# Patient Record
Sex: Male | Born: 1935 | Race: White | Hispanic: No | Marital: Married | Smoking: Former smoker
Health system: Southern US, Community
[De-identification: ages and names within clinical notes are randomized; demographics above are authoritative.]

## PROBLEM LIST (undated history)

## (undated) DIAGNOSIS — N189 Chronic kidney disease, unspecified: Secondary | ICD-10-CM

## (undated) DIAGNOSIS — N183 Chronic kidney disease, stage 3 unspecified: Secondary | ICD-10-CM

## (undated) DIAGNOSIS — C4490 Unspecified malignant neoplasm of skin, unspecified: Secondary | ICD-10-CM

## (undated) DIAGNOSIS — G309 Alzheimer's disease, unspecified: Secondary | ICD-10-CM

## (undated) DIAGNOSIS — I10 Essential (primary) hypertension: Secondary | ICD-10-CM

## (undated) DIAGNOSIS — F0280 Dementia in other diseases classified elsewhere without behavioral disturbance: Secondary | ICD-10-CM

## (undated) DIAGNOSIS — I639 Cerebral infarction, unspecified: Secondary | ICD-10-CM

## (undated) HISTORY — DX: Cerebral infarction, unspecified: I63.9

## (undated) HISTORY — DX: Alzheimer's disease, unspecified: G30.9

## (undated) HISTORY — DX: Dementia in other diseases classified elsewhere, unspecified severity, without behavioral disturbance, psychotic disturbance, mood disturbance, and anxiety: F02.80

## (undated) HISTORY — DX: Unspecified malignant neoplasm of skin, unspecified: C44.90

## (undated) HISTORY — PX: MOHS SURGERY: SUR867

## (undated) HISTORY — DX: Chronic kidney disease, stage 3 (moderate): N18.3

## (undated) HISTORY — DX: Chronic kidney disease, stage 3 unspecified: N18.30

## (undated) HISTORY — PX: OTHER SURGICAL HISTORY: SHX169

---

## 2007-08-24 ENCOUNTER — Ambulatory Visit: Payer: Self-pay | Admitting: Internal Medicine

## 2007-08-25 ENCOUNTER — Ambulatory Visit: Payer: Self-pay | Admitting: Internal Medicine

## 2007-08-26 ENCOUNTER — Ambulatory Visit: Payer: Self-pay | Admitting: Emergency Medicine

## 2007-12-15 ENCOUNTER — Emergency Department: Payer: Self-pay | Admitting: Emergency Medicine

## 2010-02-14 ENCOUNTER — Ambulatory Visit: Payer: Self-pay | Admitting: Family Medicine

## 2015-04-28 ENCOUNTER — Encounter: Payer: Self-pay | Admitting: Emergency Medicine

## 2015-04-28 ENCOUNTER — Emergency Department
Admission: EM | Admit: 2015-04-28 | Discharge: 2015-04-28 | Disposition: A | Discharge status: Home / Self Care | Payer: Medicare Other | Attending: Emergency Medicine | Admitting: Emergency Medicine

## 2015-04-28 ENCOUNTER — Emergency Department: Payer: Medicare Other

## 2015-04-28 DIAGNOSIS — Y9389 Activity, other specified: Secondary | ICD-10-CM | POA: Diagnosis not present

## 2015-04-28 DIAGNOSIS — R030 Elevated blood-pressure reading, without diagnosis of hypertension: Secondary | ICD-10-CM | POA: Insufficient documentation

## 2015-04-28 DIAGNOSIS — R55 Syncope and collapse: Secondary | ICD-10-CM | POA: Diagnosis not present

## 2015-04-28 DIAGNOSIS — Y92002 Bathroom of unspecified non-institutional (private) residence single-family (private) house as the place of occurrence of the external cause: Secondary | ICD-10-CM | POA: Diagnosis not present

## 2015-04-28 DIAGNOSIS — R42 Dizziness and giddiness: Secondary | ICD-10-CM | POA: Diagnosis present

## 2015-04-28 DIAGNOSIS — S0990XA Unspecified injury of head, initial encounter: Secondary | ICD-10-CM | POA: Insufficient documentation

## 2015-04-28 DIAGNOSIS — IMO0001 Reserved for inherently not codable concepts without codable children: Secondary | ICD-10-CM

## 2015-04-28 DIAGNOSIS — Z87891 Personal history of nicotine dependence: Secondary | ICD-10-CM | POA: Diagnosis not present

## 2015-04-28 DIAGNOSIS — F0390 Unspecified dementia without behavioral disturbance: Secondary | ICD-10-CM | POA: Diagnosis not present

## 2015-04-28 DIAGNOSIS — Z66 Do not resuscitate: Secondary | ICD-10-CM | POA: Insufficient documentation

## 2015-04-28 DIAGNOSIS — W1839XA Other fall on same level, initial encounter: Secondary | ICD-10-CM | POA: Diagnosis not present

## 2015-04-28 DIAGNOSIS — Y998 Other external cause status: Secondary | ICD-10-CM | POA: Diagnosis not present

## 2015-04-28 LAB — CBC WITH DIFFERENTIAL/PLATELET
Basophils Absolute: 0.1 10*3/uL (ref 0–0.1)
Basophils Relative: 1 %
EOS ABS: 0.1 10*3/uL (ref 0–0.7)
Eosinophils Relative: 1 %
HCT: 43.5 % (ref 40.0–52.0)
HEMOGLOBIN: 14.4 g/dL (ref 13.0–18.0)
LYMPHS ABS: 0.9 10*3/uL — AB (ref 1.0–3.6)
Lymphocytes Relative: 10 %
MCH: 29.8 pg (ref 26.0–34.0)
MCHC: 33.1 g/dL (ref 32.0–36.0)
MCV: 89.9 fL (ref 80.0–100.0)
MONO ABS: 0.4 10*3/uL (ref 0.2–1.0)
MONOS PCT: 4 %
NEUTROS ABS: 7.8 10*3/uL — AB (ref 1.4–6.5)
NEUTROS PCT: 84 %
PLATELETS: 207 10*3/uL (ref 150–440)
RBC: 4.84 MIL/uL (ref 4.40–5.90)
RDW: 14.1 % (ref 11.5–14.5)
WBC: 9.2 10*3/uL (ref 3.8–10.6)

## 2015-04-28 LAB — BASIC METABOLIC PANEL
Anion gap: 10 (ref 5–15)
BUN: 24 mg/dL — AB (ref 6–20)
CO2: 27 mmol/L (ref 22–32)
CREATININE: 1.09 mg/dL (ref 0.61–1.24)
Calcium: 8.7 mg/dL — ABNORMAL LOW (ref 8.9–10.3)
Chloride: 102 mmol/L (ref 101–111)
GFR calc Af Amer: >60 mL/min (ref >60)
GFR, EST NON AFRICAN AMERICAN: 58 mL/min — AB (ref >60)
GLUCOSE: 100 mg/dL — AB (ref 65–99)
Potassium: 3.4 mmol/L — ABNORMAL LOW (ref 3.5–5.1)
SODIUM: 139 mmol/L (ref 135–145)

## 2015-04-28 LAB — TROPONIN I: TROPONIN I: 0.03 ng/mL (ref <0.031)

## 2015-04-28 NOTE — ED Notes (Signed)
Patient transported to CT/XR ?

## 2015-04-28 NOTE — Discharge Instructions (Signed)
We noticed that Mr. Longenberger blood pressure is elevated today. We asked him to follow closely with your primary care doctor in the next day or 2 for recheck. If he has a headache, chest pain shortness of breath or any other new or worrisome symptoms including numbness or weakness or difficulty walking or talking or seems confused please return to the emergency department. He would prefer not to be admitted to the hospital which is certainly not unreasonable but it does mean that we ask you to be as visual this possible at home.

## 2015-04-28 NOTE — ED Notes (Signed)
Per MD Mcshane, no urine needed prior to d/c. Pt and family made aware

## 2015-04-28 NOTE — ED Provider Notes (Addendum)
[facility] Emergency Department Provider Note  ____________________________________________   I have reviewed the triage vital signs and the nursing notes.   HISTORY  Chief Complaint Dizziness    HPI Jacob Wade is a 82 y.o. male male who suffers from heart is appearing, he does not take any medications. His wife is with him in the bedroom tonight. He got up right here to the bathroom and he fell. She saw him fall. He hit his head. He has memory issues and does not remember the fall. His wife states he did not pass out. Patient is DO NOT RESUSCITATE. He has not had any other chest pain shows breath nausea vomiting headache or other complaints before the fall. This time his only complaint is that he bumped his head. He denies any other complaints. He has no focal numbness or weakness he has no headache and he was acting his normal self yesterday. Family states he does have a history of a subdural hematoma years ago, 30, however, no residual weakness. He himself states that he feels "okay". Family states he has poor memory at baseline but he seems like is a little bit worse today in that respect. His wife does state he seemed to be a little unsteady on his feet when he first stood up before he fell. Patient takes no medication.  Past Medical History  Diagnosis Date  . Dementia     There are no active problems to display for this patient.   History reviewed. No pertinent past surgical history.  No current outpatient prescriptions on file.  Allergies Review of patient's allergies indicates no known allergies.  History reviewed. No pertinent family history.  Social History Social History  Substance Use Topics  . Smoking status: Former Research scientist (life sciences)  . Smokeless tobacco: None  . Alcohol Use: 0.6 oz/week    1 Cans of beer per week    Review of Systems Constitutional: No fever/chills Eyes: No visual changes. ENT: No sore throat. No stiff neck no neck  pain Cardiovascular: Denies chest pain. Respiratory: Denies shortness of breath. Gastrointestinal:   no vomiting.  No diarrhea.  No constipation. Genitourinary: Negative for dysuria. Musculoskeletal: Negative lower extremity swelling Skin: Negative for rash. Neurological: Negative for headaches, focal weakness or numbness. 10-point ROS otherwise negative.  ____________________________________________   PHYSICAL EXAM:  VITAL SIGNS: ED Triage Vitals  Enc Vitals Group     BP 04/28/15 1021 194/80 mmHg     Pulse Rate 04/28/15 1021 58     Resp 04/28/15 1021 14     Temp 04/28/15 1021 97.7 F (36.5 C)     Temp Source 04/28/15 1021 Oral     SpO2 04/28/15 1021 100 %     Weight 04/28/15 1021 140 lb (63.504 kg)     Height 04/28/15 1021 5\' 10"  (1.778 m)     Head Cir --      Peak Flow --      Pain Score 04/28/15 1022 0     Pain Loc --      Pain Edu? --      Excl. in [place]? --     Constitutional: Alert and oriented to name and place unsure of the year at baseline per family. Well appearing and in no acute distress. Eyes: Conjunctivae are normal. PERRL. EOMI. Head: Slight redness noted to the forehead, no skull fracture palpated. Nose: No congestion/rhinnorhea. Mouth/Throat: Mucous membranes are moist.  Oropharynx non-erythematous. Neck: No stridor.   Nontender with no meningismus Cardiovascular:  Normal rate, regular rhythm. Grossly normal heart sounds.  Good peripheral circulation. Respiratory: Normal respiratory effort.  No retractions. Lungs CTAB. Abdominal: Soft and nontender. No distention. No guarding no rebound Back:  There is no focal tenderness or step off there is no midline tenderness there are no lesions noted. there is no CVA tenderness Musculoskeletal: No lower extremity tenderness. No joint effusions, no DVT signs strong distal pulses no edema Neurologic:  Normal speech and language. No gross focal neurologic deficits are appreciated.  Skin:  Skin is warm, dry and intact.  No rash noted. Psychiatric: Mood and affect are normal. Speech and behavior are normal.  ____________________________________________   LABS (all labs ordered are listed, but only abnormal results are displayed)  Labs Reviewed  CBC WITH DIFFERENTIAL/PLATELET  BASIC METABOLIC PANEL  TROPONIN I  URINALYSIS COMPLETEWITH MICROSCOPIC ([place])   ____________________________________________  EKG  I personally interpreted any EKGs ordered by me or triage Sinus rhythm no acute ST elevation or acute ST depression normal axis and borderline nonspecific ST changes no acute ischemia. ____________________________________________  RADIOLOGY  I reviewed any imaging ordered by me or triage that were performed during my shift ____________________________________________   PROCEDURES  Procedure(s) performed: None  Critical Care performed: None  ____________________________________________   INITIAL IMPRESSION / ASSESSMENT AND PLAN / ED COURSE  Pertinent labs & imaging results that were available during my care of the patient were reviewed by me and considered in my medical decision making (see chart for details).  Ace and has what appears to be a likely non-syncopal fall though there is some question as to whether he was unsteady on his feet beforehand. We will obtain a CT scan of his head given history of subdural and closed head injury. As was likely a non-syncopal fall however given his age, check EKG Baseline troponin and urine and reassess. Is well-appearing at this time.  ----------------------------------------- 12:37 PM on 04/28/2015 -----------------------------------------  Patient has no symptoms he is eating and drinking. His baseline blood pressures in the 170s. Family states he has very anxious when he is around doctors and that with a always expect his blood pressure go up. He has not a blood pressure because they prefer to allow his blood pressure and high they state.  He has no headache no neurologic symptoms no numbness no weakness and he is eager to go home. Family declined admission despite his elevated blood pressure which do not think is unreasonable at this time there is no evidence of ongoing pathology blood work is reassuring kidney function is preserved CT is negative neurologic exam is normal he has no chest pain and have stressed the need for outpatient follow-up and close return for new or worrisome symptoms. ____________________________________________   FINAL CLINICAL IMPRESSION(S) / ED DIAGNOSES  Final diagnoses:  None      This chart was dictated using voice recognition software.  Despite best efforts to proofread,  errors can occur which can change meaning.     Schuyler Amor, MD 04/28/15 [place], MD 04/28/15 [place], MD 04/28/15 [phone]

## 2015-04-28 NOTE — ED Notes (Addendum)
Pt arrived via EMS from [facility].  It was reported that the patient was c/o dizziness and had an unwitnessed fall. Pt states he doesn't think he fell. Pt is HOH. Wife at bedside states patient fell and hit his head on a bedside table on the left side of his head.

## 2015-05-01 DIAGNOSIS — I10 Essential (primary) hypertension: Secondary | ICD-10-CM | POA: Diagnosis not present

## 2015-05-10 DIAGNOSIS — D485 Neoplasm of uncertain behavior of skin: Secondary | ICD-10-CM | POA: Diagnosis not present

## 2015-05-10 DIAGNOSIS — L720 Epidermal cyst: Secondary | ICD-10-CM | POA: Diagnosis not present

## 2015-05-10 DIAGNOSIS — C44319 Basal cell carcinoma of skin of other parts of face: Secondary | ICD-10-CM | POA: Diagnosis not present

## 2015-05-10 DIAGNOSIS — C44219 Basal cell carcinoma of skin of left ear and external auricular canal: Secondary | ICD-10-CM | POA: Diagnosis not present

## 2015-05-10 DIAGNOSIS — C44311 Basal cell carcinoma of skin of nose: Secondary | ICD-10-CM | POA: Diagnosis not present

## 2015-06-15 DIAGNOSIS — C44319 Basal cell carcinoma of skin of other parts of face: Secondary | ICD-10-CM | POA: Diagnosis not present

## 2015-06-15 DIAGNOSIS — L905 Scar conditions and fibrosis of skin: Secondary | ICD-10-CM | POA: Diagnosis not present

## 2015-06-15 DIAGNOSIS — C4441 Basal cell carcinoma of skin of scalp and neck: Secondary | ICD-10-CM | POA: Diagnosis not present

## 2015-06-22 DIAGNOSIS — L905 Scar conditions and fibrosis of skin: Secondary | ICD-10-CM | POA: Diagnosis not present

## 2015-06-22 DIAGNOSIS — C44319 Basal cell carcinoma of skin of other parts of face: Secondary | ICD-10-CM | POA: Diagnosis not present

## 2015-07-06 DIAGNOSIS — C4441 Basal cell carcinoma of skin of scalp and neck: Secondary | ICD-10-CM | POA: Diagnosis not present

## 2015-07-06 DIAGNOSIS — G301 Alzheimer's disease with late onset: Secondary | ICD-10-CM | POA: Diagnosis not present

## 2015-07-06 DIAGNOSIS — H6122 Impacted cerumen, left ear: Secondary | ICD-10-CM | POA: Diagnosis not present

## 2015-07-06 DIAGNOSIS — Z6822 Body mass index (BMI) 22.0-22.9, adult: Secondary | ICD-10-CM | POA: Diagnosis not present

## 2015-07-06 DIAGNOSIS — H9193 Unspecified hearing loss, bilateral: Secondary | ICD-10-CM | POA: Diagnosis not present

## 2015-07-06 DIAGNOSIS — C44310 Basal cell carcinoma of skin of unspecified parts of face: Secondary | ICD-10-CM | POA: Diagnosis not present

## 2015-07-06 DIAGNOSIS — F0280 Dementia in other diseases classified elsewhere without behavioral disturbance: Secondary | ICD-10-CM | POA: Diagnosis not present

## 2015-07-06 DIAGNOSIS — C4491 Basal cell carcinoma of skin, unspecified: Secondary | ICD-10-CM | POA: Diagnosis not present

## 2015-07-06 DIAGNOSIS — F0390 Unspecified dementia without behavioral disturbance: Secondary | ICD-10-CM | POA: Diagnosis not present

## 2015-07-06 DIAGNOSIS — I10 Essential (primary) hypertension: Secondary | ICD-10-CM | POA: Diagnosis not present

## 2015-07-06 DIAGNOSIS — R4189 Other symptoms and signs involving cognitive functions and awareness: Secondary | ICD-10-CM | POA: Diagnosis not present

## 2015-07-06 DIAGNOSIS — Z974 Presence of external hearing-aid: Secondary | ICD-10-CM | POA: Diagnosis not present

## 2015-08-15 ENCOUNTER — Inpatient Hospital Stay: Payer: Medicare Other

## 2015-08-15 ENCOUNTER — Inpatient Hospital Stay
Admission: EM | Admit: 2015-08-15 | Discharge: 2015-08-17 | DRG: 872 | Payer: Medicare Other | Attending: Internal Medicine | Admitting: Internal Medicine

## 2015-08-15 ENCOUNTER — Encounter: Payer: Self-pay | Admitting: *Deleted

## 2015-08-15 DIAGNOSIS — Z66 Do not resuscitate: Secondary | ICD-10-CM | POA: Diagnosis present

## 2015-08-15 DIAGNOSIS — Z8249 Family history of ischemic heart disease and other diseases of the circulatory system: Secondary | ICD-10-CM

## 2015-08-15 DIAGNOSIS — Z87891 Personal history of nicotine dependence: Secondary | ICD-10-CM

## 2015-08-15 DIAGNOSIS — N139 Obstructive and reflux uropathy, unspecified: Secondary | ICD-10-CM | POA: Diagnosis not present

## 2015-08-15 DIAGNOSIS — I129 Hypertensive chronic kidney disease with stage 1 through stage 4 chronic kidney disease, or unspecified chronic kidney disease: Secondary | ICD-10-CM | POA: Diagnosis present

## 2015-08-15 DIAGNOSIS — R338 Other retention of urine: Secondary | ICD-10-CM | POA: Diagnosis not present

## 2015-08-15 DIAGNOSIS — I10 Essential (primary) hypertension: Secondary | ICD-10-CM | POA: Diagnosis not present

## 2015-08-15 DIAGNOSIS — Z23 Encounter for immunization: Secondary | ICD-10-CM | POA: Diagnosis not present

## 2015-08-15 DIAGNOSIS — Z801 Family history of malignant neoplasm of trachea, bronchus and lung: Secondary | ICD-10-CM | POA: Diagnosis not present

## 2015-08-15 DIAGNOSIS — E872 Acidosis: Secondary | ICD-10-CM | POA: Diagnosis present

## 2015-08-15 DIAGNOSIS — N189 Chronic kidney disease, unspecified: Secondary | ICD-10-CM | POA: Diagnosis present

## 2015-08-15 DIAGNOSIS — N390 Urinary tract infection, site not specified: Secondary | ICD-10-CM | POA: Diagnosis not present

## 2015-08-15 DIAGNOSIS — A419 Sepsis, unspecified organism: Secondary | ICD-10-CM | POA: Diagnosis not present

## 2015-08-15 DIAGNOSIS — N401 Enlarged prostate with lower urinary tract symptoms: Secondary | ICD-10-CM | POA: Diagnosis not present

## 2015-08-15 DIAGNOSIS — Z888 Allergy status to other drugs, medicaments and biological substances status: Secondary | ICD-10-CM | POA: Diagnosis not present

## 2015-08-15 DIAGNOSIS — N138 Other obstructive and reflux uropathy: Secondary | ICD-10-CM | POA: Diagnosis present

## 2015-08-15 DIAGNOSIS — F0390 Unspecified dementia without behavioral disturbance: Secondary | ICD-10-CM | POA: Diagnosis present

## 2015-08-15 DIAGNOSIS — I4581 Long QT syndrome: Secondary | ICD-10-CM | POA: Diagnosis not present

## 2015-08-15 HISTORY — DX: Essential (primary) hypertension: I10

## 2015-08-15 HISTORY — DX: Chronic kidney disease, unspecified: N18.9

## 2015-08-15 LAB — CBC
HEMATOCRIT: 43.1 % (ref 40.0–52.0)
Hemoglobin: 14.1 g/dL (ref 13.0–18.0)
MCH: 29.6 pg (ref 26.0–34.0)
MCHC: 32.8 g/dL (ref 32.0–36.0)
MCV: 90.1 fL (ref 80.0–100.0)
PLATELETS: 171 10*3/uL (ref 150–440)
RBC: 4.78 MIL/uL (ref 4.40–5.90)
RDW: 14.6 % — AB (ref 11.5–14.5)
WBC: 21.9 10*3/uL — AB (ref 3.8–10.6)

## 2015-08-15 LAB — URINALYSIS COMPLETE WITH MICROSCOPIC (ARMC ONLY)
Bilirubin Urine: NEGATIVE
Glucose, UA: NEGATIVE mg/dL
Ketones, ur: NEGATIVE mg/dL
Nitrite: NEGATIVE
PH: 7 (ref 5.0–8.0)
PROTEIN: 100 mg/dL — AB
SPECIFIC GRAVITY, URINE: 1.014 (ref 1.005–1.030)
Squamous Epithelial / LPF: NONE SEEN

## 2015-08-15 LAB — LACTIC ACID, PLASMA
LACTIC ACID, VENOUS: 3.8 mmol/L — AB (ref 0.5–2.0)
LACTIC ACID, VENOUS: 1.4 mmol/L (ref 0.5–2.0)

## 2015-08-15 LAB — BASIC METABOLIC PANEL
ANION GAP: 11 (ref 5–15)
BUN: 19 mg/dL (ref 6–20)
CALCIUM: 8.6 mg/dL — AB (ref 8.9–10.3)
CO2: 26 mmol/L (ref 22–32)
Chloride: 99 mmol/L — ABNORMAL LOW (ref 101–111)
Creatinine, Ser: 1.12 mg/dL (ref 0.61–1.24)
GFR calc Af Amer: >60 mL/min (ref >60)
GFR, EST NON AFRICAN AMERICAN: 56 mL/min — AB (ref >60)
Glucose, Bld: 112 mg/dL — ABNORMAL HIGH (ref 65–99)
POTASSIUM: 3.4 mmol/L — AB (ref 3.5–5.1)
SODIUM: 136 mmol/L (ref 135–145)

## 2015-08-15 LAB — MAGNESIUM: Magnesium: 1.7 mg/dL (ref 1.7–2.4)

## 2015-08-15 MED ORDER — HEPARIN SODIUM (PORCINE) 5000 UNIT/ML IJ SOLN
5000.0000 [IU] | Freq: Three times a day (TID) | INTRAMUSCULAR | Status: DC
  Administered 2015-08-16 – 2015-08-17 (×2): 5000 [IU] via SUBCUTANEOUS
  Filled 2015-08-15 (×4): qty 1

## 2015-08-15 MED ORDER — PNEUMOCOCCAL VAC POLYVALENT 25 MCG/0.5ML IJ INJ
0.5000 mL | INJECTION | INTRAMUSCULAR | Status: AC
  Administered 2015-08-16: 0.5 mL via INTRAMUSCULAR
  Filled 2015-08-15: qty 0.5

## 2015-08-15 MED ORDER — POTASSIUM CHLORIDE CRYS ER 20 MEQ PO TBCR
EXTENDED_RELEASE_TABLET | ORAL | Status: AC
  Filled 2015-08-15: qty 1

## 2015-08-15 MED ORDER — SODIUM CHLORIDE 0.9 % IV SOLN
INTRAVENOUS | Status: AC
  Administered 2015-08-15 – 2015-08-16 (×3): via INTRAVENOUS

## 2015-08-15 MED ORDER — ACETAMINOPHEN 325 MG PO TABS
650.0000 mg | ORAL_TABLET | Freq: Once | ORAL | Status: AC
  Administered 2015-08-15: 650 mg via ORAL
  Filled 2015-08-15: qty 2

## 2015-08-15 MED ORDER — AMLODIPINE BESYLATE 5 MG PO TABS
5.0000 mg | ORAL_TABLET | Freq: Every day | ORAL | Status: DC
  Administered 2015-08-16: 5 mg via ORAL
  Filled 2015-08-15: qty 1

## 2015-08-15 MED ORDER — HYDRALAZINE HCL 20 MG/ML IJ SOLN
10.0000 mg | Freq: Four times a day (QID) | INTRAMUSCULAR | Status: DC | PRN
  Filled 2015-08-15 (×2): qty 1

## 2015-08-15 MED ORDER — SODIUM CHLORIDE 0.9 % IV BOLUS (SEPSIS)
500.0000 mL | Freq: Once | INTRAVENOUS | Status: AC
  Administered 2015-08-15: 500 mL via INTRAVENOUS

## 2015-08-15 MED ORDER — SODIUM CHLORIDE 0.9 % IV BOLUS (SEPSIS)
1000.0000 mL | Freq: Once | INTRAVENOUS | Status: AC
  Administered 2015-08-15: 1000 mL via INTRAVENOUS

## 2015-08-15 MED ORDER — POTASSIUM CHLORIDE CRYS ER 20 MEQ PO TBCR
20.0000 meq | EXTENDED_RELEASE_TABLET | Freq: Two times a day (BID) | ORAL | Status: DC
  Administered 2015-08-15 – 2015-08-16 (×2): 20 meq via ORAL
  Filled 2015-08-15: qty 1

## 2015-08-15 MED ORDER — LORAZEPAM 2 MG/ML IJ SOLN
0.5000 mg | Freq: Once | INTRAMUSCULAR | Status: AC
  Administered 2015-08-15: 0.5 mg via INTRAVENOUS
  Filled 2015-08-15: qty 1

## 2015-08-15 MED ORDER — DEXTROSE 5 % IV SOLN
1.0000 g | Freq: Once | INTRAVENOUS | Status: AC
  Administered 2015-08-15: 1 g via INTRAVENOUS
  Filled 2015-08-15: qty 10

## 2015-08-15 MED ORDER — TAMSULOSIN HCL 0.4 MG PO CAPS
0.4000 mg | ORAL_CAPSULE | Freq: Every day | ORAL | Status: DC
  Administered 2015-08-16 – 2015-08-17 (×2): 0.4 mg via ORAL
  Filled 2015-08-15 (×2): qty 1

## 2015-08-15 MED ORDER — DEXTROSE 5 % IV SOLN
1.0000 g | INTRAVENOUS | Status: DC
  Administered 2015-08-16: 1 g via INTRAVENOUS
  Filled 2015-08-15 (×2): qty 10

## 2015-08-15 NOTE — H&P (Signed)
[place] [facility] NAME: Jacob Wade    MR#:  [id number]  DATE OF BIRTH:  May 12, 1923  DATE OF ADMISSION:  08/15/2015  PRIMARY CARE PHYSICIAN: No primary care provider on file.   REQUESTING/REFERRING PHYSICIAN: McShane  CHIEF COMPLAINT:   Chief Complaint  Patient presents with  . Fever  . Dysuria    HISTORY OF PRESENT ILLNESS: Jacob Wade  is a 82 y.o. male with a known history of Dementia and hypertension, not on any regular medications at home. Lives in [facility] [place] [facility] facility for last 7 years with his wife, and both of them are having worsening dementia and now, but will not agree to accept any more help as per the daughter who is healthcare power of attorney in present in the room during my interview. As per daughter patient was not doing too good yesterday as she heard from her mother, and today when she went to visit him he was urinating very frequently but very little amount, concerned with this she brought him to emergency room and he was noted to have slight fever with elevated white cell count and urinary obstruction. Before bringing to ER she took him to urgent care Center also but they suggested to take him to emergency room. In ER her Foley catheter was placed and started on antibiotics after collecting the culture samples, he was also noted to have lactic acidosis.  PAST MEDICAL HISTORY:   Past Medical History  Diagnosis Date  . Dementia   . Hypertension   . Chronic kidney disease     PAST SURGICAL HISTORY: History reviewed. No pertinent past surgical history.  SOCIAL HISTORY:  Social History  Substance Use Topics  . Smoking status: Former Research scientist (life sciences)  . Smokeless tobacco: Not on file  . Alcohol Use: 0.6 oz/week    1 Cans of beer per week    FAMILY HISTORY:  Family History  Problem Relation Age of Onset  . CAD Father   . Lung cancer Brother     DRUG ALLERGIES:  Allergies  Allergen Reactions  .  Cephalosporins Other (See Comments)    Reaction:  Tremors   . Ciprofloxacin Other (See Comments)    Reaction:  Unknown     REVIEW OF SYSTEMS:   Because of dementia and hearing deficit patient is not complaining anything.  MEDICATIONS AT HOME:  Prior to Admission medications   Not on File      PHYSICAL EXAMINATION:   VITAL SIGNS: Blood pressure 171/92, pulse 92, temperature 100.9 F (38.3 C), temperature source Oral, resp. rate 16, height 5\' 8"  (1.727 m), weight 68.04 kg (150 lb), SpO2 95 %.  GENERAL:  82 y.o.-year-old patient lying in the bed with no acute distress.  EYES: Pupils equal, round, reactive to light and accommodation. No scleral icterus. Extraocular muscles intact.  HEENT: Head atraumatic, normocephalic. Oropharynx and nasopharynx clear.  NECK:  Supple, no jugular venous distention. No thyroid enlargement, no tenderness.  LUNGS: Normal breath sounds bilaterally, no wheezing, rales,rhonchi or crepitation. No use of accessory muscles of respiration.  CARDIOVASCULAR: S1, S2 normal. No murmurs, rubs, or gallops.  ABDOMEN: Soft, mild lower area tender, nondistended. Bowel sounds present. No organomegaly or mass. Foley in place. EXTREMITIES: No pedal edema, cyanosis, or clubbing.  NEUROLOGIC: Cranial nerves II through XII are intact. Muscle strength 5/5 in all extremities. Sensation intact. Gait not checked.  PSYCHIATRIC: The patient is alert and oriented x 1.  SKIN: No obvious rash,  lesion, or ulcer.   LABORATORY PANEL:   CBC  Recent Labs Lab 08/15/15 1531  WBC 21.9*  HGB 14.1  HCT 43.1  PLT 171  MCV 90.1  MCH 29.6  MCHC 32.8  RDW 14.6*   ------------------------------------------------------------------------------------------------------------------  Chemistries   Recent Labs Lab 08/15/15 1531  NA 136  K 3.4*  CL 99*  CO2 26  GLUCOSE 112*  BUN 19  CREATININE 1.12  CALCIUM 8.6*    ------------------------------------------------------------------------------------------------------------------ estimated creatinine clearance is 43 mL/min (by C-G formula based on Cr of 1.12). ------------------------------------------------------------------------------------------------------------------ No results for input(s): TSH, T4TOTAL, T3FREE, THYROIDAB in the last 72 hours.  Invalid input(s): FREET3   Coagulation profile No results for input(s): INR, PROTIME in the last 168 hours. ------------------------------------------------------------------------------------------------------------------- No results for input(s): DDIMER in the last 72 hours. -------------------------------------------------------------------------------------------------------------------  Cardiac Enzymes No results for input(s): CKMB, TROPONINI, MYOGLOBIN in the last 168 hours.  Invalid input(s): CK ------------------------------------------------------------------------------------------------------------------ Invalid input(s): POCBNP  ---------------------------------------------------------------------------------------------------------------  Urinalysis    Component Value Date/Time   COLORURINE YELLOW* 08/15/2015 1515   APPEARANCEUR CLOUDY* 08/15/2015 1515   LABSPEC 1.014 08/15/2015 1515   PHURINE 7.0 08/15/2015 1515   GLUCOSEU NEGATIVE 08/15/2015 1515   HGBUR 3+* 08/15/2015 1515   BILIRUBINUR NEGATIVE 08/15/2015 1515   KETONESUR NEGATIVE 08/15/2015 1515   PROTEINUR 100* 08/15/2015 1515   NITRITE NEGATIVE 08/15/2015 1515   LEUKOCYTESUR 1+* 08/15/2015 1515     RADIOLOGY: No results found.  EKG: Orders placed or performed during the hospital encounter of 04/28/15  . ED EKG  . ED EKG  . EKG 12-Lead  . EKG 12-Lead    IMPRESSION AND PLAN:  * Sepsis   This is secondary to UTI.   Evident by fever, elevated white cell count, lactic acidosis.   IV fluid, IV ceftriaxone,  follow cultures, follow lactic acid.  * Urinary retention   Foley catheter is placed   Get ultrasound renal.   Urology consult for further management.   Flomax.  * Hypertension   Started on amlodipine oral, hydralazine injection as needed basis.  * Chronic kidney disease   Stable, continue monitoring.  * Dementia   He may need to be moved to a memory care unit or require more help at [facility] facility rather than being on independent living.  All the records are reviewed and case discussed with ED provider. Management plans discussed with the patient, family and they are in agreement.  CODE STATUS: DO NOT RESUSCITATE Code Status History    This patient does not have a recorded code status. Please follow your organizational policy for patients in this situation.     Patient's daughter who is healthcare power of attorney, present in the room. My visit and she confirms the findings and plan and she confirms his DO NOT RESUSCITATE status.  TOTAL TIME TAKING CARE OF THIS PATIENT: 50 minutes.    Vaughan Basta M.D on 08/15/2015   Between 7am to 6pm - Pager - [phone]  After 6pm go to [email or web address] - password [email or web address] [facility]  Office  [phone]  CC: Primary care physician; No primary care provider on file.   Note: This dictation was prepared with Dragon dictation along with smaller phrase technology. Any transcriptional errors that result from this process are unintentional.

## 2015-08-15 NOTE — ED Notes (Signed)
CODE SEPSIS CALLED TO [facility] AT [facility] 

## 2015-08-15 NOTE — ED Notes (Signed)
Pt transferred to room 209. 

## 2015-08-15 NOTE — ED Provider Notes (Addendum)
[facility] Emergency Department Provider Note  ____________________________________________   I have reviewed the triage vital signs and the nursing notes.   HISTORY  Chief Complaint Fever and Dysuria    HPI Jacob Wade is a 82 y.o. male who unfortunately suffers from dementia, had difficulty urinating all morning and lower abdominal pain and a fever. He denies any vomiting. History is mostly per his daughter, patient's poor recollection of his symptoms.Level 5 chart caveat; no further history available due to patient status. The patient according to family, is at his baseline neurologically.   Past Medical History  Diagnosis Date  . Dementia     There are no active problems to display for this patient.   History reviewed. No pertinent past surgical history.  No current outpatient prescriptions on file.  Allergies Review of patient's allergies indicates no known allergies.  No family history on file.  Social History Social History  Substance Use Topics  . Smoking status: Former Research scientist (life sciences)  . Smokeless tobacco: None  . Alcohol Use: 0.6 oz/week    1 Cans of beer per week    Review of Systems Constitutional: Positive fever Eyes: No visual changes. ENT: No sore throat. No stiff neck no neck pain Cardiovascular: Denies chest pain. Respiratory: Denies shortness of breath. Gastrointestinal:   no vomiting.  No diarrhea.  No constipation. Genitourinary: Positive for dysuria and inability to urinate today. Musculoskeletal: Negative lower extremity swelling Skin: Negative for rash. Neurological: Negative for headaches, focal weakness or numbness. 10-point ROS otherwise negative.  ____________________________________________   PHYSICAL EXAM:  VITAL SIGNS: ED Triage Vitals  Enc Vitals Group     BP 08/15/15 1439 185/80 mmHg     Pulse Rate 08/15/15 1439 90     Resp 08/15/15 1439 20     Temp 08/15/15 1439 100.9 F (38.3 C)     Temp Source  08/15/15 1439 Oral     SpO2 08/15/15 1439 98 %     Weight 08/15/15 1439 150 lb (68.04 kg)     Height 08/15/15 1439 5\' 8"  (1.727 m)     Head Cir --      Peak Flow --      Pain Score --      Pain Loc --      Pain Edu? --      Excl. in [place]? --     Constitutional: Alert and orientedTo name and place unsure of date pleasantly demented. Well appearing and in no acute distress. Eyes: Conjunctivae are normal. PERRL. EOMI. Head: Atraumatic. Nose: No congestion/rhinnorhea. Mouth/Throat: Mucous membranes are moist.  Oropharynx non-erythematous. Neck: No stridor.   Nontender with no meningismus Cardiovascular: Normal rate, regular rhythm. Grossly normal heart sounds.  Good peripheral circulation. Respiratory: Normal respiratory effort.  No retractions. Lungs CTAB. Abdominal: Soft and suprapubic tenderness is noted and a fullness around the bladder, no guarding or rebound. No pulsatile mass  Back:  There is no focal tenderness or step off there is no midline tenderness there are no lesions noted. there is no CVA tenderness Normal external male genitalia Musculoskeletal: No lower extremity tenderness. No joint effusions, no DVT signs strong distal pulses no edema Neurologic:  Normal speech and language. No gross focal neurologic deficits are appreciated.  Skin:  Skin is warm, dry and intact. No rash noted. Psychiatric: Mood and affect are normal. Speech and behavior are normal.  ____________________________________________   LABS (all labs ordered are listed, but only abnormal results are displayed)  Labs Reviewed  URINALYSIS COMPLETEWITH MICROSCOPIC ([facility] ONLY) - Abnormal; Notable for the following:    Color, Urine YELLOW (*)    APPearance CLOUDY (*)    Hgb urine dipstick 3+ (*)    Protein, ur 100 (*)    Leukocytes, UA 1+ (*)    Bacteria, UA RARE (*)    All other components within normal limits  CBC - Abnormal; Notable for the following:    WBC 21.9 (*)    RDW 14.6 (*)    All other  components within normal limits  CULTURE, BLOOD (ROUTINE X 2)  CULTURE, BLOOD (ROUTINE X 2)  URINE CULTURE  BASIC METABOLIC PANEL  LACTIC ACID, PLASMA  LACTIC ACID, PLASMA   ____________________________________________  EKG  I personally interpreted any EKGs ordered by me or triage  ____________________________________________  RADIOLOGY  I reviewed any imaging ordered by me or triage that were performed during my shift and, if possible, patient and/or family made aware of any abnormal findings. ____________________________________________   PROCEDURES  Procedure(s) performed: None  Critical Care performed: CRITICAL CARE Performed by: Schuyler Amor   Total critical care time: 39 minutes  Critical care time was exclusive of separately billable procedures and treating other patients.  Critical care was necessary to treat or prevent imminent or life-threatening deterioration.  Critical care was time spent personally by me on the following activities: development of treatment plan with patient and/or surrogate as well as nursing, discussions with consultants, evaluation of patient's response to treatment, examination of patient, obtaining history from patient or surrogate, ordering and performing treatments and interventions, ordering and review of laboratory studies, ordering and review of radiographic studies, pulse oximetry and re-evaluation of patient's condition.   ____________________________________________   INITIAL IMPRESSION / ASSESSMENT AND PLAN / ED COURSE  Pertinent labs & imaging results that were available during my care of the patient were reviewed by me and considered in my medical decision making (see chart for details).  Patient with evidence of UTI and urinary retention, Foley was placed with over 400 cc of urine, elevated white count as noted. Cultures have been obtained, giving the patient IV antibiotics and will  reassess.  ----------------------------------------- 4:57 PM on 08/15/2015 -----------------------------------------  Patient with significant urinary tract infection abdomen benign after Foley placement, discussed with hospitalist they will admit.Patient does not have septic vital signs but he does have a high lactate we will give him aggressive IV fluid but given his age we will not give the full sepsis protocol complement of liters of fluid as he has no evidence of decompensation in his hemodynamics and he is significantly advanced in age _hospitalist agrees with plan. Including Rocephin. ___________________________________________   FINAL CLINICAL IMPRESSION(S) / ED DIAGNOSES  Final diagnoses:  None      This chart was dictated using voice recognition software.  Despite best efforts to proofread,  errors can occur which can change meaning.     Schuyler Amor, MD 08/15/15 [id number]  Schuyler Amor, MD 08/15/15 [place], MD 08/15/15 [place], MD 08/15/15 [phone]

## 2015-08-15 NOTE — ED Notes (Signed)
Pt reports fever, dysuria and lower right sided abdominal pain for the last two days

## 2015-08-16 ENCOUNTER — Telehealth: Payer: Self-pay

## 2015-08-16 DIAGNOSIS — N390 Urinary tract infection, site not specified: Secondary | ICD-10-CM

## 2015-08-16 DIAGNOSIS — A419 Sepsis, unspecified organism: Secondary | ICD-10-CM

## 2015-08-16 DIAGNOSIS — R338 Other retention of urine: Secondary | ICD-10-CM

## 2015-08-16 DIAGNOSIS — N401 Enlarged prostate with lower urinary tract symptoms: Secondary | ICD-10-CM

## 2015-08-16 LAB — BASIC METABOLIC PANEL
ANION GAP: 6 (ref 5–15)
BUN: 15 mg/dL (ref 6–20)
CHLORIDE: 111 mmol/L (ref 101–111)
CO2: 24 mmol/L (ref 22–32)
Calcium: 8 mg/dL — ABNORMAL LOW (ref 8.9–10.3)
Creatinine, Ser: 0.86 mg/dL (ref 0.61–1.24)
GFR calc Af Amer: >60 mL/min (ref >60)
GLUCOSE: 104 mg/dL — AB (ref 65–99)
POTASSIUM: 3.4 mmol/L — AB (ref 3.5–5.1)
Sodium: 141 mmol/L (ref 135–145)

## 2015-08-16 LAB — CBC
HEMATOCRIT: 37.3 % — AB (ref 40.0–52.0)
HEMOGLOBIN: 12.5 g/dL — AB (ref 13.0–18.0)
MCH: 30 pg (ref 26.0–34.0)
MCHC: 33.4 g/dL (ref 32.0–36.0)
MCV: 89.9 fL (ref 80.0–100.0)
Platelets: 157 10*3/uL (ref 150–440)
RBC: 4.15 MIL/uL — ABNORMAL LOW (ref 4.40–5.90)
RDW: 14.6 % — AB (ref 11.5–14.5)
WBC: 17.4 10*3/uL — ABNORMAL HIGH (ref 3.8–10.6)

## 2015-08-16 LAB — MRSA PCR SCREENING: MRSA by PCR: NEGATIVE

## 2015-08-16 MED ORDER — SODIUM CHLORIDE 0.9 % IV SOLN: INTRAVENOUS | Status: DC

## 2015-08-16 MED ORDER — POTASSIUM CHLORIDE CRYS ER 20 MEQ PO TBCR
40.0000 meq | EXTENDED_RELEASE_TABLET | ORAL | Status: AC
  Administered 2015-08-16 (×2): 40 meq via ORAL
  Filled 2015-08-16 (×2): qty 2

## 2015-08-16 NOTE — Telephone Encounter (Signed)
-----   Message from Nickie Retort, MD sent at 08/16/2015 12:14 PM EDT ----- Patient needs to be seen in one week in the office for trial of void with any provider. He is currently in the hospital.

## 2015-08-16 NOTE — Progress Notes (Signed)
[facility] NAME: Archie Little    MR#:  [id number]  DATE OF BIRTH:  [birth year]  SUBJECTIVE:  CHIEF COMPLAINT:   Chief Complaint  Patient presents with  . Fever  . Dysuria   Patient has no concerns. Pleasantly confused. Sitting up in a chair. Sitter at bedside.  REVIEW OF SYSTEMS:    Review of Systems  Unable to perform ROS: dementia    DRUG ALLERGIES:   Allergies  Allergen Reactions  . Cephalosporins Other (See Comments)    Reaction:  Tremors   . Ciprofloxacin Other (See Comments)    Reaction:  Unknown     VITALS:  Blood pressure 150/69, pulse 87, temperature 98.6 F (37 C), temperature source Oral, resp. rate 20, height 5\' 8"  (1.727 m), weight 68.04 kg (150 lb), SpO2 99 %.  PHYSICAL EXAMINATION:   Physical Exam  GENERAL:  82 y.o.-year-old patient lying in the bed with no acute distress.  EYES: Pupils equal, round, reactive to light and accommodation. No scleral icterus. Extraocular muscles intact.  HEENT: Head atraumatic, normocephalic. Oropharynx and nasopharynx clear.  NECK:  Supple, no jugular venous distention. No thyroid enlargement, no tenderness.  LUNGS: Normal breath sounds bilaterally, no wheezing, rales, rhonchi. No use of accessory muscles of respiration.  CARDIOVASCULAR: S1, S2 normal. No murmurs, rubs, or gallops.  ABDOMEN: Soft, nontender, nondistended. Bowel sounds present. No organomegaly or mass.  EXTREMITIES: No cyanosis, clubbing or edema b/l.    NEUROLOGIC: Cranial nerves II through XII are intact. No focal Motor or sensory deficits b/l.   PSYCHIATRIC: The patient is alert and oriented x 3.  SKIN: No obvious rash, lesion, or ulcer.   Foley catheter in place with clear urine LABORATORY PANEL:   CBC  Recent Labs Lab 08/16/15 0336  WBC 17.4*  HGB 12.5*  HCT 37.3*  PLT 157    ------------------------------------------------------------------------------------------------------------------ Chemistries   Recent Labs Lab 08/15/15 1531 08/16/15 0336  NA 136 141  K 3.4* 3.4*  CL 99* 111  CO2 26 24  GLUCOSE 112* 104*  BUN 19 15  CREATININE 1.12 0.86  CALCIUM 8.6* 8.0*  MG 1.7  --    ------------------------------------------------------------------------------------------------------------------  Cardiac Enzymes No results for input(s): TROPONINI in the last 168 hours. ------------------------------------------------------------------------------------------------------------------  RADIOLOGY:  US Renal  08/15/2015  CLINICAL DATA:  Urinary obstruction EXAM: RENAL / URINARY TRACT ULTRASOUND COMPLETE COMPARISON:  None. FINDINGS: Right Kidney: Length: 11.0 cm. Echogenicity within normal limits. No mass or hydronephrosis visualized. Left Kidney: Length: 10.8 cm. Echogenicity within normal limits. No mass or hydronephrosis visualized. Bladder: Decompressed by Foley catheter. IMPRESSION: No acute abnormality noted. Electronically Signed   By: Inez Catalina M.D.   On: 08/15/2015 18:17     ASSESSMENT AND PLAN:   * Sepsis with UTI On IV fluids. Sepsis improved. IV ceftriaxone. Wait for culture results.  * Urinary retention Likely due to UTI. May have underlying benign prostatic hypertrophy. Flomax started. Continue Foley catheter. Urology input appreciated Follow-up with urology as outpatient for a voiding trial  * Hypertension, uncontrolled  Started on amlodipine oral Follow blood pressure. When necessary IV hydralazine.  * Chronic kidney disease  Stable, continue monitoring.  * Dementia  Progressively worsening. Discussed with social work. Daughter looking into options.  All the records are reviewed and case discussed with Care Management/Social Workerr. Management plans discussed with the patient, family and they are in agreement.  CODE  STATUS: DNR  DVT Prophylaxis: SCDs  TOTAL TIME  TAKING CARE OF THIS PATIENT: 35 minutes.   POSSIBLE D/C IN 1-2 DAYS, DEPENDING ON CLINICAL CONDITION.  Hillary Bow R M.D on 08/16/2015 at 12:42 PM  Between 7am to 6pm - Pager - [phone]  After 6pm go to [email or web address] - password [email or web address] [facility]  [facility]  Office  [phone]  CC: Primary care physician; No primary care provider on file.  Note: This dictation was prepared with Dragon dictation along with smaller phrase technology. Any transcriptional errors that result from this process are unintentional.

## 2015-08-16 NOTE — Clinical Social Work Note (Signed)
Clinical Social Work Assessment  Patient Details  Name: Jacob Wade MRN: [id number] Date of Birth: 09/10/1923  Date of referral:  08/16/15               Reason for consult:  Facility Placement                Permission sought to share information with:    Permission granted to share information::     Name::        Agency::     Relationship::     Contact Information:     Housing/Transportation Living arrangements for the past 2 months:  Independent Living Facility Source of Information:  Patient, Adult Children Patient Interpreter Needed:  None Criminal Activity/Legal Involvement Pertinent to Current Situation/Hospitalization:  No - Comment as needed Significant Relationships:  Adult Children, Spouse Lives with:  Facility Resident Do you feel safe going back to the place where you live?    Need for family participation in patient care:     Care giving concerns:  Patient and his wife reside at [facility] at [facility].  Social Worker assessment / plan:  Independent Living confirmed by Andrea at [facility] and Andrea mentioned to CSW that the daughter was interested in a higher level of care for patient at their healthcare building. CSW met with patient's daughter and patient in patient's room this morning. Patient is very hard of hearing and has a diagnosis of dementia. When I mentioned the possibility of considering rehab, patient's daughter stated "no that is not what he needs, he needs to go to the healthcare building." Patient's daughter requested to discuss more outside of patient's room. Patient's daughter stated patient needed placement for his dementia and she did not understand why insurance would not cover this if patient transitioned to the healthcare building. She repeated that she did not believe her father needed physical therapy rehab. Patient's daughter stated "why can't [facility] put a sitter with him?" CSW informed patient's daughter that CSW would have Andrea  at [facility] contact her so she could discuss options further for patient at [facility].   Andrea at [facility] aware of the above and contacted patient's daughter and when daughter discovered she would have to pay out of pocket for her father to go to the Healthcare/rehab side the daughter then stated that patient would need rehab. PT attempted to work with patient but he refused. Staff sitting with patient states patient ambulates fine and without an assistive device.    Employment status:  Retired Insurance information:  Medicare PT Recommendations:  Not assessed at this time Information / Referral to community resources:     Patient/Family's Response to care:  Patient's daughter was frustrated that patient was not able to transition at [facility] and his insurance possibly not cover it.   Patient/Family's Understanding of and Emotional Response to Diagnosis, Current Treatment, and Prognosis:  Patient's daughter not accepting of the above and has now decided to say patient will require rehab.  Emotional Assessment Appearance:  Appears stated age Attitude/Demeanor/Rapport:  Suspicious Affect (typically observed):  Quiet Orientation:  Oriented to Self Alcohol / Substance use:  Not Applicable Psych involvement (Current and /or in the community):  No (Comment)  Discharge Needs  Concerns to be addressed:  Care Coordination Readmission within the last 30 days:  No Current discharge risk:  None Barriers to Discharge:  No Barriers Identified    , LCSW 08/16/2015, 12:16 PM  

## 2015-08-16 NOTE — Care Management (Signed)
It was reported that patient was from [facility] assisted living but CSW found that patient and his wife live in independent at [facility].  At present, it is anticipated that patient will discharge to the health care building.  Obtained order for physical therapy consult.

## 2015-08-16 NOTE — Progress Notes (Signed)
PT Cancellation Note  Patient Details Name: Jacob Wade MRN: [id number] DOB: [birth year]   Cancelled Treatment:    Reason Eval/Treat Not Completed: Other (comment). Evaluation attempted, however pt sleeping. Sitter in room. Therapist woke pt up and pt reports he prefers not to participate in therapy at this time. He then went back to sleep. Will re-attempt next date.   [name] 08/16/2015, 4:10 PM Greggory Stallion, PT, DPT [phone]

## 2015-08-16 NOTE — Consult Note (Signed)
@ENCDATE @ 12:08 PM   Jacob Wade 1923/07/14 [id number]  Referring provider: Dr. Boykin Reaper  Chief Complaint  Patient presents with  . Fever  . Dysuria    HPI: The patient is an 82 -year-old male with severe dementia who presented to the hospital with 400 cc urinary retention. The patient has dementia and is unable to provide any past medical history. His daughter is in the room by some history. Apparently at one point he was diagnosed BPH but he continually forgot to take his Flomax so it is no longer prescribed. He also has a urinalysis concerning for infection. Foley catheter is in place. Renal ultrasound as well.   PMH: Past Medical History  Diagnosis Date  . Dementia   . Hypertension   . Chronic kidney disease     Surgical History: History reviewed. No pertinent past surgical history.  Home Medications:    Medication List    Notice    You have not been prescribed any medications.      Allergies:  Allergies  Allergen Reactions  . Cephalosporins Other (See Comments)    Reaction:  Tremors   . Ciprofloxacin Other (See Comments)    Reaction:  Unknown     Family History: Family History  Problem Relation Age of Onset  . CAD Father   . Lung cancer Brother     Social History:  reports that he has quit smoking. He does not have any smokeless tobacco history on file. He reports that he drinks about 0.6 oz of alcohol per week. His drug history is not on file.  ROS: Unable to obtain due to patient factors                                        Physical Exam: BP 150/69 mmHg  Pulse 87  Temp(Src) 98.6 F (37 C) (Oral)  Resp 20  Ht 5\' 8"  (1.727 m)  Wt 150 lb (68.04 kg)  BMI 22.81 kg/m2  SpO2 99%  Constitutional:  Alert and oriented, No acute distress. HEENT: [place] AT, moist mucus membranes.  Trachea midline, no masses. Cardiovascular: No clubbing, cyanosis, or edema. Respiratory: Normal respiratory effort, no increased work of  breathing. GI: Abdomen is soft, nontender, nondistended, no abdominal masses GU: No CVA tenderness. Normal phallus. Testicles are bilaterally. Foley in place strain clear yellow urine. Skin: No rashes, bruises or suspicious lesions. Lymph: No cervical or inguinal adenopathy. Neurologic: Grossly intact, no focal deficits, moving all 4 extremities. Psychiatric: Normal mood and affect.  Laboratory Data: Lab Results  Component Value Date   WBC 17.4* 08/16/2015   HGB 12.5* 08/16/2015   HCT 37.3* 08/16/2015   MCV 89.9 08/16/2015   PLT 157 08/16/2015    Lab Results  Component Value Date   CREATININE 0.86 08/16/2015    No results found for: PSA  No results found for: TESTOSTERONE  No results found for: HGBA1C  Urinalysis    Component Value Date/Time   COLORURINE YELLOW* 08/15/2015 1515   APPEARANCEUR CLOUDY* 08/15/2015 1515   LABSPEC 1.014 08/15/2015 1515   PHURINE 7.0 08/15/2015 1515   GLUCOSEU NEGATIVE 08/15/2015 1515   HGBUR 3+* 08/15/2015 1515   BILIRUBINUR NEGATIVE 08/15/2015 1515   KETONESUR NEGATIVE 08/15/2015 1515   PROTEINUR 100* 08/15/2015 1515   NITRITE NEGATIVE 08/15/2015 1515   LEUKOCYTESUR 1+* 08/15/2015 1515    Pertinent Imaging: CLINICAL DATA: Urinary obstruction  EXAM: RENAL / URINARY TRACT ULTRASOUND COMPLETE  COMPARISON: None.  FINDINGS: Right Kidney:  Length: 11.0 cm. Echogenicity within normal limits. No mass or hydronephrosis visualized.  Left Kidney:  Length: 10.8 cm. Echogenicity within normal limits. No mass or hydronephrosis visualized.  Bladder:  Decompressed by Foley catheter.  IMPRESSION: No acute abnormality noted.   Assessment & Plan:    1. BPH 2. Urinary retention 3. UTI -Continue Flomax -Continue Foley catheter -Continue antibiotics pending culture and sensitivity results -Avoid narcotics and anticholinergics as tolerated -The patient will need a follow up in one week [facility]  for a trial of void  Nickie Retort, [place] [address], [place], [place] [address] [phone]

## 2015-08-17 LAB — CBC WITH DIFFERENTIAL/PLATELET
BASOS ABS: 0 10*3/uL (ref 0–0.1)
EOS ABS: 0 10*3/uL (ref 0–0.7)
HCT: 34.4 % — ABNORMAL LOW (ref 40.0–52.0)
HEMOGLOBIN: 11.5 g/dL — AB (ref 13.0–18.0)
Lymphocytes Relative: 8 %
Lymphs Abs: 1 10*3/uL (ref 1.0–3.6)
MCH: 30.5 pg (ref 26.0–34.0)
MCHC: 33.5 g/dL (ref 32.0–36.0)
MCV: 91 fL (ref 80.0–100.0)
Monocytes Absolute: 1 10*3/uL (ref 0.2–1.0)
Monocytes Relative: 8 %
Neutro Abs: 10.2 10*3/uL — ABNORMAL HIGH (ref 1.4–6.5)
PLATELETS: 156 10*3/uL (ref 150–440)
RBC: 3.78 MIL/uL — AB (ref 4.40–5.90)
RDW: 14.3 % (ref 11.5–14.5)
WBC: 12.3 10*3/uL — AB (ref 3.8–10.6)

## 2015-08-17 LAB — BASIC METABOLIC PANEL
Anion gap: 5 (ref 5–15)
BUN: 14 mg/dL (ref 6–20)
CHLORIDE: 108 mmol/L (ref 101–111)
CO2: 24 mmol/L (ref 22–32)
CREATININE: 0.97 mg/dL (ref 0.61–1.24)
Calcium: 7.9 mg/dL — ABNORMAL LOW (ref 8.9–10.3)
Glucose, Bld: 106 mg/dL — ABNORMAL HIGH (ref 65–99)
POTASSIUM: 3.4 mmol/L — AB (ref 3.5–5.1)
SODIUM: 137 mmol/L (ref 135–145)

## 2015-08-17 LAB — URINE CULTURE: Culture: >=100000 — AB

## 2015-08-17 MED ORDER — AMLODIPINE BESYLATE 10 MG PO TABS
10.0000 mg | ORAL_TABLET | Freq: Every day | ORAL | Status: DC
  Administered 2015-08-17: 10 mg via ORAL
  Filled 2015-08-17: qty 1

## 2015-08-17 MED ORDER — AMLODIPINE BESYLATE 10 MG PO TABS: 10.0000 mg | ORAL_TABLET | Freq: Every day | ORAL | Status: DC

## 2015-08-17 MED ORDER — SULFAMETHOXAZOLE-TRIMETHOPRIM 800-160 MG PO TABS: 1.0000 | ORAL_TABLET | Freq: Two times a day (BID) | ORAL | Status: DC

## 2015-08-17 MED ORDER — CEFUROXIME AXETIL 250 MG PO TABS: 250.0000 mg | ORAL_TABLET | Freq: Two times a day (BID) | ORAL | Status: DC

## 2015-08-17 MED ORDER — POTASSIUM CHLORIDE CRYS ER 20 MEQ PO TBCR: 40.0000 meq | EXTENDED_RELEASE_TABLET | ORAL | Status: DC

## 2015-08-17 MED ORDER — TAMSULOSIN HCL 0.4 MG PO CAPS: 0.4000 mg | ORAL_CAPSULE | Freq: Every day | ORAL | Status: AC

## 2015-08-17 MED ORDER — POTASSIUM CHLORIDE CRYS ER 20 MEQ PO TBCR
40.0000 meq | EXTENDED_RELEASE_TABLET | ORAL | Status: AC
  Administered 2015-08-17 (×2): 40 meq via ORAL
  Filled 2015-08-17 (×2): qty 2

## 2015-08-17 NOTE — Discharge Instructions (Signed)

## 2015-08-17 NOTE — Discharge Summary (Addendum)
[facility] NAME: Jacob Wade    MR#:  [id number]  DATE OF BIRTH:  21-Mar-1924  DATE OF ADMISSION:  08/15/2015 ADMITTING PHYSICIAN: Vaughan Basta, MD  DATE OF DISCHARGE: No discharge date for patient encounter.  PRIMARY CARE PHYSICIAN: No primary care provider on file.   ADMISSION DIAGNOSIS:  Sepsis due to urinary tract infection ([place]) [A41.9, N39.0] Urinary obstruction [N13.9]  DISCHARGE DIAGNOSIS:  Principal Problem:   Sepsis ([place]) Active Problems:   UTI (lower urinary tract infection)   Urinary obstruction   SECONDARY DIAGNOSIS:   Past Medical History  Diagnosis Date  . Dementia   . Hypertension   . Chronic kidney disease      ADMITTING HISTORY  HISTORY OF PRESENT ILLNESS: Jacob Wade is a 82 y.o. male with a known history of Dementia and hypertension, not on any regular medications at home. [facility] for last 7 years with his wife, and both of them are having worsening dementia and now, but will not agree to accept any more help as per the daughter who is healthcare power of attorney in present in the room during my interview. As per daughter patient was not doing too good yesterday as she heard from her mother, and today when she went to visit him he was urinating very frequently but very little amount, concerned with this she brought him to emergency room and he was noted to have slight fever with elevated white cell count and urinary obstruction. Before bringing to ER she took him to urgent care Center also but they suggested to take him to emergency room. In ER her Foley catheter was placed and started on antibiotics after collecting the culture samples, he was also noted to have lactic acidosis.  HOSPITAL COURSE:   * Sepsis with UTI On IV fluids. Sepsis improved. IV ceftriaxone in hospital. Change to Ceftin at discharge  * Urinary retention Likely due  to UTI. May have underlying benign prostatic hypertrophy. Flomax started. Continue Foley catheter. Urology input appreciated Follow-up with urology as outpatient in 1 week for a voiding trial  * Hypertension, uncontrolled  Started on amlodipine oral Follow blood pressure.   * Chronic kidney disease  Stable, continue monitoring.  * Dementia  Progressively worsening. Discussed with social work.  Transfer to NH . Stable  CONSULTS OBTAINED:  Treatment Team:  Festus Aloe, MD  DRUG ALLERGIES:   Allergies  Allergen Reactions  . Cephalosporins Other (See Comments)    Reaction:  Tremors   . Ciprofloxacin Other (See Comments)    Reaction:  Unknown     DISCHARGE MEDICATIONS:   Current Discharge Medication List    START taking these medications   Details  amLODipine (NORVASC) 10 MG tablet Take 1 tablet (10 mg total) by mouth daily. Qty: 30 tablet, Refills: 0    sulfamethoxazole-trimethoprim (BACTRIM DS,SEPTRA DS) 800-160 MG tablet Take 1 tablet by mouth 2 (two) times daily. Qty: 6 tablet, Refills: 0    tamsulosin (FLOMAX) 0.4 MG CAPS capsule Take 1 capsule (0.4 mg total) by mouth daily. Qty: 30 capsule, Refills: 0        Today   VITAL SIGNS:  Blood pressure 151/63, pulse 74, temperature 98.5 F (36.9 C), temperature source Oral, resp. rate 18, height 5\' 8"  (1.727 m), weight 68.04 kg (150 lb), SpO2 91 %.  I/O:   Intake/Output Summary (Last 24 hours) at 08/17/15 1223 Last data filed at 08/17/15 1150  Gross per 24 hour  Intake 1291.32 ml  Output   2300 ml  Net -1008.68 ml    PHYSICAL EXAMINATION:  Physical Exam  GENERAL:  82 y.o.-year-old patient lying in the bed with no acute distress.  LUNGS: Normal breath sounds bilaterally, no wheezing, rales,rhonchi or crepitation.  CARDIOVASCULAR: S1, S2 normal. No murmurs, rubs, or gallops.  PSYCHIATRIC: The patient is alert and awake. Pleasantly confused Foley in place  DATA REVIEW:   CBC  Recent  Labs Lab 08/17/15 0459  WBC 12.3*  HGB 11.5*  HCT 34.4*  PLT 156    Chemistries   Recent Labs Lab 08/15/15 1531  08/17/15 0459  NA 136  < > 137  K 3.4*  < > 3.4*  CL 99*  < > 108  CO2 26  < > 24  GLUCOSE 112*  < > 106*  BUN 19  < > 14  CREATININE 1.12  < > 0.97  CALCIUM 8.6*  < > 7.9*  MG 1.7  --   --   < > = values in this interval not displayed.  Cardiac Enzymes No results for input(s): TROPONINI in the last 168 hours.  Microbiology Results  Results for orders placed or performed during the hospital encounter of 08/15/15  Urine culture     Status: Abnormal   Collection Time: 08/15/15  3:15 PM  Result Value Ref Range Status   Specimen Description URINE, RANDOM  Final   Special Requests NONE  Final   Culture >=100,000 COLONIES/mL KLEBSIELLA OXYTOCA (A)  Final   Report Status 08/17/2015 FINAL  Final   Organism ID, Bacteria KLEBSIELLA OXYTOCA (A)  Final      Susceptibility   Klebsiella oxytoca - MIC*    AMPICILLIN >=32 RESISTANT Resistant     CEFAZOLIN >=64 RESISTANT Resistant     CEFTRIAXONE <=1 SENSITIVE Sensitive     CIPROFLOXACIN <=0.25 SENSITIVE Sensitive     GENTAMICIN <=1 SENSITIVE Sensitive     IMIPENEM <=0.25 SENSITIVE Sensitive     NITROFURANTOIN <=16 SENSITIVE Sensitive     TRIMETH/SULFA <=20 SENSITIVE Sensitive     AMPICILLIN/SULBACTAM 8 SENSITIVE Sensitive     PIP/TAZO <=4 SENSITIVE Sensitive     Extended ESBL NEGATIVE Sensitive     * >=100,000 COLONIES/mL KLEBSIELLA OXYTOCA  Culture, blood (routine x 2)     Status: None (Preliminary result)   Collection Time: 08/15/15  3:20 PM  Result Value Ref Range Status   Specimen Description BLOOD LEFT HAND  Final   Special Requests BOTTLES DRAWN AEROBIC AND ANAEROBIC 6CC  Final   Culture NO GROWTH 2 DAYS  Final   Report Status PENDING  Incomplete  Culture, blood (routine x 2)     Status: None (Preliminary result)   Collection Time: 08/15/15  3:31 PM  Result Value Ref Range Status   Specimen Description  BLOOD  Final   Special Requests NONE  Final   Culture NO GROWTH 2 DAYS  Final   Report Status PENDING  Incomplete  MRSA PCR Screening     Status: None   Collection Time: 08/16/15  6:30 AM  Result Value Ref Range Status   MRSA by PCR NEGATIVE NEGATIVE Final    Comment:        The GeneXpert MRSA Assay (FDA approved for NASAL specimens only), is one component of a comprehensive MRSA colonization surveillance program. It is not intended to diagnose MRSA infection nor to guide or monitor treatment for MRSA infections.     RADIOLOGY:  US Renal  08/15/2015  CLINICAL DATA:  Urinary obstruction EXAM: RENAL / URINARY TRACT ULTRASOUND COMPLETE COMPARISON:  None. FINDINGS: Right Kidney: Length: 11.0 cm. Echogenicity within normal limits. No mass or hydronephrosis visualized. Left Kidney: Length: 10.8 cm. Echogenicity within normal limits. No mass or hydronephrosis visualized. Bladder: Decompressed by Foley catheter. IMPRESSION: No acute abnormality noted. Electronically Signed   By: Inez Catalina M.D.   On: 08/15/2015 18:17    Follow up with PCP in 1 week.  Management plans discussed with the patient, family and they are in agreement.  CODE STATUS:     Code Status Orders        Start     Ordered   08/15/15 2014  Do not attempt resuscitation (DNR)   Continuous    Question Answer Comment  In the event of cardiac or respiratory ARREST Do not call a "code blue"   In the event of cardiac or respiratory ARREST Do not perform Intubation, CPR, defibrillation or ACLS   In the event of cardiac or respiratory ARREST Use medication by any route, position, wound care, and other measures to relive pain and suffering. May use oxygen, suction and manual treatment of airway obstruction as needed for comfort.   Comments confirmed with HCPOA- daughter.      08/15/15 2013    Code Status History    Date Active Date Inactive Code Status Order ID Comments User Context   This patient has a current code  status but no historical code status.    Advance Directive Documentation        Most Recent Value   Type of Advance Directive  Healthcare Power of Attorney, Living will   Pre-existing out of facility DNR order (yellow form or pink MOST form)     "MOST" Form in Place?        TOTAL TIME TAKING CARE OF THIS PATIENT ON DAY OF DISCHARGE: more than 30 minutes.   Hillary Bow R M.D on 08/17/2015 at 12:23 PM  Between 7am to 6pm - Pager - [phone]  After 6pm go to [email or web address] - password [email or web address] [facility]  [facility]  Office  [phone]  CC: Primary care physician; No primary care provider on file.  Note: This dictation was prepared with Dragon dictation along with smaller phrase technology. Any transcriptional errors that result from this process are unintentional.

## 2015-08-17 NOTE — Evaluation (Signed)
Physical Therapy Evaluation Patient Details Name: Jacob Wade MRN: [id number] DOB: [birth year] Today's Date: 08/17/2015   History of Present Illness  Pt is admitted for sepsis. Pt with complaints of fever. Pt with history of dementia, HTN, and CKD. Pt currently lives at [facility] for 7 years with his wife.  Clinical Impression  Pt demonstrates all bed mobility/transfers/ambulation at baseline level. Pt does not require any further PT needs at this time. Pt will be dc in house and does not require follow up. RN aware. Will dc current orders.     Follow Up Recommendations No PT follow up    Equipment Recommendations  None recommended by PT    Recommendations for Other Services       Precautions / Restrictions Precautions Precautions: Fall Restrictions Weight Bearing Restrictions: No      Mobility  Bed Mobility Overal bed mobility: Independent             General bed mobility comments: safe technique performed. Cues given for precuations regarding lines/leads  Transfers Overall transfer level: Independent Equipment used: None             General transfer comment: safe technique performed. No AD used.  Ambulation/Gait Ambulation/Gait assistance: Supervision Ambulation Distance (Feet): 200 Feet Assistive device: None Gait Pattern/deviations: WFL(Within Functional Limits)     General Gait Details: ambulated using no AD with supervision. Reciprocal gait pattern performed with safe technique. Pt able to carry conversation during ambulation.  Stairs            Wheelchair Mobility    Modified Rankin (Stroke Patients Only)       Balance Overall balance assessment: Independent                                           Pertinent Vitals/Pain Pain Assessment: No/denies pain    Home Living Family/patient expects to be discharged to::  (independent living)                      Prior Function Level of Independence:  Independent               Hand Dominance        Extremity/Trunk Assessment   Upper Extremity Assessment: Overall WFL for tasks assessed           Lower Extremity Assessment: Overall WFL for tasks assessed         Communication   Communication: HOH  Cognition Arousal/Alertness: Awake/alert Behavior During Therapy: WFL for tasks assessed/performed Overall Cognitive Status: Within Functional Limits for tasks assessed                      General Comments      Exercises        Assessment/Plan    PT Assessment Patent does not need any further PT services  PT Diagnosis     PT Problem List    PT Treatment Interventions     PT Goals (Current goals can be found in the Care Plan section) Acute Rehab PT Goals Patient Stated Goal: to go back to [facility] PT Goal Formulation: All assessment and education complete, DC therapy Time For Goal Achievement: 08/17/15 Potential to Achieve Goals: Good    Frequency     Barriers to discharge        Co-evaluation  End of Session Equipment Utilized During Treatment: Gait belt Activity Tolerance: Patient tolerated treatment well Patient left: in bed;with bed alarm set;with nursing/sitter in room Nurse Communication: Mobility status         Time: 1004-1013 PT Time Calculation (min) (ACUTE ONLY): 9 min   Charges:   PT Evaluation $PT Eval Low Complexity: 1 Procedure     PT G Codes:        Teya Otterson 2015-09-04, 10:39 AM  Greggory Stallion, PT, DPT [phone]

## 2015-08-17 NOTE — Clinical Social Work Note (Signed)
Patient to discharge today to Healthcare at [facility] and patient's daughter has informed CSW it will be private pay. CSW has spoken to [place] at [facility]. CSW will forward the discharge information when time. Shela Leff MSW,LCSW [phone]

## 2015-08-17 NOTE — NC FL2 (Signed)
  [place] LEVEL OF CARE SCREENING TOOL     IDENTIFICATION  Patient Name: Jacob Wade Birthdate: Feb 20, 1924 Sex: male Admission Date (Current Location): 08/15/2015  [place] and [place] Number:  Engineering geologist and Address:  [facility], [address] [place], [place] [address]      Provider Number: [id number]  Attending Physician Name and Address:  Hillary Bow, MD  Relative Name and Phone Number:       Current Level of Care: Hospital Recommended Level of Care: [place] Prior Approval Number:    Date Approved/Denied:   PASRR Number:    Discharge Plan: SNF    Current Diagnoses: Patient Active Problem List   Diagnosis Date Noted  . Sepsis ([place]) 08/15/2015  . UTI (lower urinary tract infection) 08/15/2015  . Urinary obstruction 08/15/2015    Orientation RESPIRATION BLADDER Height & Weight     Self, Place  Normal Continent Weight: 150 lb (68.04 kg) Height:  5\' 8"  (172.7 cm)  BEHAVIORAL SYMPTOMS/MOOD NEUROLOGICAL BOWEL NUTRITION STATUS   (none)  (none) Continent Diet (regular)  AMBULATORY STATUS COMMUNICATION OF NEEDS Skin   Supervision Verbally Normal                       Personal Care Assistance Level of Assistance  Bathing, Dressing Bathing Assistance: Limited assistance   Dressing Assistance: Limited assistance     Functional Limitations Info  Hearing   Hearing Info: Impaired      SPECIAL CARE FACTORS FREQUENCY                       Contractures Contractures Info: Not present    Additional Factors Info  Code Status Code Status Info: DNR             Current Medications (08/17/2015):  This is the current hospital active medication list Current Facility-Administered Medications  Medication Dose Route Frequency Provider Last Rate Last Dose  . amLODipine (NORVASC) tablet 10 mg  10 mg Oral Daily Hillary Bow, MD   10 mg at 08/17/15 0901  . cefTRIAXone (ROCEPHIN) 1 g  in dextrose 5 % 50 mL IVPB  1 g Intravenous Q24H Vaughan Basta, MD   1 g at 08/16/15 1548  . heparin injection 5,000 Units  5,000 Units Subcutaneous Q8H Vaughan Basta, MD   5,000 Units at 08/16/15 1405  . hydrALAZINE (APRESOLINE) injection 10 mg  10 mg Intravenous Q6H PRN Vaughan Basta, MD      . tamsulosin (FLOMAX) capsule 0.4 mg  0.4 mg Oral Daily Vaughan Basta, MD   0.4 mg at 08/17/15 0901     Discharge Medications: Please see discharge summary for a list of discharge medications.  Relevant Imaging Results:  Relevant Lab Results:   Additional Information SS: [id number]  Shela Leff, LCSW

## 2015-08-17 NOTE — Telephone Encounter (Signed)
I spoke with the patient wife and scheduled the appt for 08-29-15 with shannon And mailed out new pt pw  Sharyn Lull

## 2015-08-17 NOTE — Progress Notes (Signed)
Pt prepared for d/c to [facility]. IV d/c'd, central telemetry was removed. Skin intact except as charted in most recent assessments. Vitals are stable. Report called to receiving facility. Pt escorted out by sectary and transported by son to [facility].  Angus Seller

## 2015-08-17 NOTE — Care Management Important Message (Signed)
Important Message  Patient Details  Name: Jacob Wade MRN: [id number] Date of Birth: May 03, 1923   Medicare Important Message Given:  Yes    Juliann Pulse A Dwon Sky 08/17/2015, 11:00 AM

## 2015-08-18 ENCOUNTER — Inpatient Hospital Stay
Admission: EM | Admit: 2015-08-18 | Discharge: 2015-08-25 | DRG: 871 | Disposition: A | Discharge status: Skilled Nursing Facility | Payer: Medicare Other | Attending: Internal Medicine | Admitting: Internal Medicine

## 2015-08-18 ENCOUNTER — Inpatient Hospital Stay: Payer: Medicare Other

## 2015-08-18 ENCOUNTER — Emergency Department: Payer: Medicare Other

## 2015-08-18 ENCOUNTER — Encounter: Payer: Self-pay | Admitting: Medical Oncology

## 2015-08-18 DIAGNOSIS — E860 Dehydration: Secondary | ICD-10-CM | POA: Diagnosis present

## 2015-08-18 DIAGNOSIS — Z881 Allergy status to other antibiotic agents status: Secondary | ICD-10-CM

## 2015-08-18 DIAGNOSIS — Z8249 Family history of ischemic heart disease and other diseases of the circulatory system: Secondary | ICD-10-CM

## 2015-08-18 DIAGNOSIS — I10 Essential (primary) hypertension: Secondary | ICD-10-CM | POA: Diagnosis not present

## 2015-08-18 DIAGNOSIS — E876 Hypokalemia: Secondary | ICD-10-CM | POA: Diagnosis present

## 2015-08-18 DIAGNOSIS — N289 Disorder of kidney and ureter, unspecified: Secondary | ICD-10-CM

## 2015-08-18 DIAGNOSIS — R4189 Other symptoms and signs involving cognitive functions and awareness: Secondary | ICD-10-CM | POA: Diagnosis not present

## 2015-08-18 DIAGNOSIS — Z7401 Bed confinement status: Secondary | ICD-10-CM | POA: Diagnosis not present

## 2015-08-18 DIAGNOSIS — R6889 Other general symptoms and signs: Secondary | ICD-10-CM | POA: Diagnosis not present

## 2015-08-18 DIAGNOSIS — R269 Unspecified abnormalities of gait and mobility: Secondary | ICD-10-CM | POA: Diagnosis not present

## 2015-08-18 DIAGNOSIS — Z87891 Personal history of nicotine dependence: Secondary | ICD-10-CM | POA: Diagnosis not present

## 2015-08-18 DIAGNOSIS — A419 Sepsis, unspecified organism: Secondary | ICD-10-CM

## 2015-08-18 DIAGNOSIS — N400 Enlarged prostate without lower urinary tract symptoms: Secondary | ICD-10-CM

## 2015-08-18 DIAGNOSIS — N390 Urinary tract infection, site not specified: Secondary | ICD-10-CM | POA: Diagnosis present

## 2015-08-18 DIAGNOSIS — N189 Chronic kidney disease, unspecified: Secondary | ICD-10-CM | POA: Diagnosis present

## 2015-08-18 DIAGNOSIS — Z801 Family history of malignant neoplasm of trachea, bronchus and lung: Secondary | ICD-10-CM | POA: Diagnosis not present

## 2015-08-18 DIAGNOSIS — N401 Enlarged prostate with lower urinary tract symptoms: Secondary | ICD-10-CM | POA: Diagnosis present

## 2015-08-18 DIAGNOSIS — J189 Pneumonia, unspecified organism: Secondary | ICD-10-CM

## 2015-08-18 DIAGNOSIS — N139 Obstructive and reflux uropathy, unspecified: Secondary | ICD-10-CM | POA: Diagnosis present

## 2015-08-18 DIAGNOSIS — F17210 Nicotine dependence, cigarettes, uncomplicated: Secondary | ICD-10-CM | POA: Diagnosis present

## 2015-08-18 DIAGNOSIS — N138 Other obstructive and reflux uropathy: Secondary | ICD-10-CM | POA: Diagnosis present

## 2015-08-18 DIAGNOSIS — F0390 Unspecified dementia without behavioral disturbance: Secondary | ICD-10-CM | POA: Diagnosis present

## 2015-08-18 DIAGNOSIS — R509 Fever, unspecified: Secondary | ICD-10-CM

## 2015-08-18 DIAGNOSIS — Z66 Do not resuscitate: Secondary | ICD-10-CM | POA: Diagnosis present

## 2015-08-18 DIAGNOSIS — Z880 Allergy status to penicillin: Secondary | ICD-10-CM

## 2015-08-18 DIAGNOSIS — R918 Other nonspecific abnormal finding of lung field: Secondary | ICD-10-CM | POA: Diagnosis not present

## 2015-08-18 DIAGNOSIS — M6281 Muscle weakness (generalized): Secondary | ICD-10-CM | POA: Diagnosis not present

## 2015-08-18 DIAGNOSIS — Z888 Allergy status to other drugs, medicaments and biological substances status: Secondary | ICD-10-CM

## 2015-08-18 DIAGNOSIS — J690 Pneumonitis due to inhalation of food and vomit: Secondary | ICD-10-CM | POA: Diagnosis present

## 2015-08-18 DIAGNOSIS — G309 Alzheimer's disease, unspecified: Secondary | ICD-10-CM | POA: Diagnosis not present

## 2015-08-18 DIAGNOSIS — D72829 Elevated white blood cell count, unspecified: Secondary | ICD-10-CM

## 2015-08-18 DIAGNOSIS — I129 Hypertensive chronic kidney disease with stage 1 through stage 4 chronic kidney disease, or unspecified chronic kidney disease: Secondary | ICD-10-CM | POA: Diagnosis present

## 2015-08-18 DIAGNOSIS — A4189 Other specified sepsis: Principal | ICD-10-CM | POA: Diagnosis present

## 2015-08-18 LAB — URINALYSIS COMPLETE WITH MICROSCOPIC (ARMC ONLY)
BILIRUBIN URINE: NEGATIVE
Glucose, UA: NEGATIVE mg/dL
KETONES UR: NEGATIVE mg/dL
Nitrite: NEGATIVE
PH: 7 (ref 5.0–8.0)
Protein, ur: 100 mg/dL — AB
Specific Gravity, Urine: 1.014 (ref 1.005–1.030)

## 2015-08-18 LAB — CBC WITH DIFFERENTIAL/PLATELET
BASOS ABS: 0.1 10*3/uL (ref 0–0.1)
Basophils Relative: 1 %
EOS ABS: 0.1 10*3/uL (ref 0–0.7)
HCT: 35.6 % — ABNORMAL LOW (ref 40.0–52.0)
Hemoglobin: 11.9 g/dL — ABNORMAL LOW (ref 13.0–18.0)
LYMPHS ABS: 0.1 10*3/uL — AB (ref 1.0–3.6)
Lymphocytes Relative: 1 %
MCH: 30.2 pg (ref 26.0–34.0)
MCHC: 33.4 g/dL (ref 32.0–36.0)
MCV: 90.4 fL (ref 80.0–100.0)
MONO ABS: 0.9 10*3/uL (ref 0.2–1.0)
Monocytes Relative: 6 %
NEUTROS ABS: 13.5 10*3/uL — AB (ref 1.4–6.5)
PLATELETS: 186 10*3/uL (ref 150–440)
RBC: 3.94 MIL/uL — AB (ref 4.40–5.90)
RDW: 13.8 % (ref 11.5–14.5)
WBC: 14.7 10*3/uL — ABNORMAL HIGH (ref 3.8–10.6)

## 2015-08-18 LAB — COMPREHENSIVE METABOLIC PANEL
ALBUMIN: 2.7 g/dL — AB (ref 3.5–5.0)
ALT: 11 U/L — ABNORMAL LOW (ref 17–63)
ANION GAP: 7 (ref 5–15)
AST: 16 U/L (ref 15–41)
Alkaline Phosphatase: 48 U/L (ref 38–126)
BILIRUBIN TOTAL: 0.3 mg/dL (ref 0.3–1.2)
BUN: 24 mg/dL — ABNORMAL HIGH (ref 6–20)
CO2: 23 mmol/L (ref 22–32)
Calcium: 8 mg/dL — ABNORMAL LOW (ref 8.9–10.3)
Chloride: 105 mmol/L (ref 101–111)
Creatinine, Ser: 1.49 mg/dL — ABNORMAL HIGH (ref 0.61–1.24)
GFR calc Af Amer: 46 mL/min — ABNORMAL LOW (ref >60)
GFR calc non Af Amer: 40 mL/min — ABNORMAL LOW (ref >60)
GLUCOSE: 126 mg/dL — AB (ref 65–99)
POTASSIUM: 3.6 mmol/L (ref 3.5–5.1)
SODIUM: 135 mmol/L (ref 135–145)
TOTAL PROTEIN: 6 g/dL — AB (ref 6.5–8.1)

## 2015-08-18 LAB — LACTIC ACID, PLASMA
LACTIC ACID, VENOUS: 1.1 mmol/L (ref 0.5–2.0)
Lactic Acid, Venous: 1.4 mmol/L (ref 0.5–2.0)

## 2015-08-18 MED ORDER — TAMSULOSIN HCL 0.4 MG PO CAPS
0.4000 mg | ORAL_CAPSULE | Freq: Every day | ORAL | Status: DC
  Administered 2015-08-19 – 2015-08-25 (×7): 0.4 mg via ORAL
  Filled 2015-08-18 (×7): qty 1

## 2015-08-18 MED ORDER — SODIUM CHLORIDE 0.9 % IV BOLUS (SEPSIS)
500.0000 mL | Freq: Once | INTRAVENOUS | Status: AC
  Administered 2015-08-18: 500 mL via INTRAVENOUS

## 2015-08-18 MED ORDER — ACETAMINOPHEN 650 MG RE SUPP: 650.0000 mg | Freq: Four times a day (QID) | RECTAL | Status: DC | PRN

## 2015-08-18 MED ORDER — POTASSIUM CHLORIDE CRYS ER 20 MEQ PO TBCR: 40.0000 meq | EXTENDED_RELEASE_TABLET | Freq: Once | ORAL | Status: DC

## 2015-08-18 MED ORDER — ACETAMINOPHEN 325 MG PO TABS
650.0000 mg | ORAL_TABLET | Freq: Four times a day (QID) | ORAL | Status: DC | PRN
  Administered 2015-08-22 (×2): 650 mg via ORAL
  Filled 2015-08-18 (×2): qty 2

## 2015-08-18 MED ORDER — DEXTROSE 5 % IV SOLN
2.0000 g | Freq: Once | INTRAVENOUS | Status: AC
  Administered 2015-08-18: 2 g via INTRAVENOUS
  Filled 2015-08-18: qty 2

## 2015-08-18 MED ORDER — ONDANSETRON HCL 4 MG/2ML IJ SOLN: 4.0000 mg | Freq: Four times a day (QID) | INTRAMUSCULAR | Status: DC | PRN

## 2015-08-18 MED ORDER — ONDANSETRON HCL 4 MG PO TABS: 4.0000 mg | ORAL_TABLET | Freq: Four times a day (QID) | ORAL | Status: DC | PRN

## 2015-08-18 MED ORDER — PIPERACILLIN-TAZOBACTAM 3.375 G IVPB
3.3750 g | Freq: Once | INTRAVENOUS | Status: AC
  Administered 2015-08-18: 3.375 g via INTRAVENOUS

## 2015-08-18 MED ORDER — ENOXAPARIN SODIUM 40 MG/0.4ML ~~LOC~~ SOLN
40.0000 mg | SUBCUTANEOUS | Status: DC
  Administered 2015-08-18 – 2015-08-24 (×7): 40 mg via SUBCUTANEOUS
  Filled 2015-08-18 (×7): qty 0.4

## 2015-08-18 MED ORDER — SODIUM CHLORIDE 0.9 % IV SOLN
INTRAVENOUS | Status: DC
  Administered 2015-08-18 – 2015-08-21 (×4): via INTRAVENOUS

## 2015-08-18 MED ORDER — PIPERACILLIN-TAZOBACTAM 3.375 G IVPB
3.3750 g | Freq: Three times a day (TID) | INTRAVENOUS | Status: DC
  Administered 2015-08-19 – 2015-08-20 (×4): 3.375 g via INTRAVENOUS
  Filled 2015-08-18 (×7): qty 50

## 2015-08-18 MED ORDER — SENNOSIDES-DOCUSATE SODIUM 8.6-50 MG PO TABS: 1.0000 | ORAL_TABLET | Freq: Every evening | ORAL | Status: DC | PRN

## 2015-08-18 MED ORDER — SODIUM CHLORIDE 0.9 % IV BOLUS (SEPSIS)
1000.0000 mL | Freq: Once | INTRAVENOUS | Status: AC
  Administered 2015-08-18: 1000 mL via INTRAVENOUS

## 2015-08-18 MED ORDER — AMLODIPINE BESYLATE 10 MG PO TABS
10.0000 mg | ORAL_TABLET | Freq: Every day | ORAL | Status: DC
  Administered 2015-08-19 – 2015-08-25 (×6): 10 mg via ORAL
  Filled 2015-08-18 (×7): qty 1

## 2015-08-18 NOTE — ED Notes (Signed)
MD at bedside. And family at bedside.

## 2015-08-18 NOTE — H&P (Addendum)
[place] at [place] NAME: Jacob Wade    MR#:  [id number]  DATE OF BIRTH:  1924-02-04  DATE OF ADMISSION:  08/18/2015  PRIMARY CARE PHYSICIAN: Starling Manns  REQUESTING/REFERRING PHYSICIAN: Dr. Burlene Arnt  CHIEF COMPLAINT:   Lethargy and fever HISTORY OF PRESENT ILLNESS:  Jacob Wade  is a 82 y.o. male with a known history of Dementia who was discharged yesterday after being diagnosed with Klebsiella UTI. His urine culture was sensitive to Bactrim which she was discharged with. He presents today with lethargy and fever. He did fever 100.3 in the ER. He started on broad-spectrum antibiotics and blood cultures have been ordered. Chest x-ray shows atelectasis versus pneumonia.  PAST MEDICAL HISTORY:   Past Medical History  Diagnosis Date  . Dementia   . Hypertension   . Chronic kidney disease     PAST SURGICAL HISTORY:  History reviewed. No pertinent past surgical history.  SOCIAL HISTORY:   Social History  Substance Use Topics  . Smoking status: Former Research scientist (life sciences)  . Smokeless tobacco: Not on file  . Alcohol Use: 0.6 oz/week    1 Cans of beer per week    FAMILY HISTORY:   Family History  Problem Relation Age of Onset  . CAD Father   . Lung cancer Brother     DRUG ALLERGIES:   Allergies  Allergen Reactions  . Cephalosporins Other (See Comments)    Reaction:  Tremors   . Ciprofloxacin Other (See Comments)    Reaction:  Unknown     REVIEW OF SYSTEMS:   Review of Systems  Constitutional: Positive for fever and malaise/fatigue. Negative for chills.  HENT: Negative for ear discharge, ear pain, hearing loss, nosebleeds and sore throat.   Eyes: Negative for blurred vision and pain.  Respiratory: Negative for cough, hemoptysis, shortness of breath and wheezing.   Cardiovascular: Negative for chest pain, palpitations and leg swelling.  Gastrointestinal: Negative for nausea, vomiting, abdominal pain, diarrhea and blood in  stool.  Genitourinary: Negative for dysuria.  Musculoskeletal: Negative for back pain.  Neurological: Positive for weakness. Negative for dizziness, tremors, speech change, focal weakness, seizures and headaches.  Endo/Heme/Allergies: Does not bruise/bleed easily.  Psychiatric/Behavioral: Positive for memory loss. Negative for depression, suicidal ideas and hallucinations.    MEDICATIONS AT HOME:   Prior to Admission medications   Medication Sig Start Date End Date Taking? Authorizing Provider  amLODipine (NORVASC) 10 MG tablet Take 1 tablet (10 mg total) by mouth daily. Patient not taking: Reported on 08/18/2015 08/17/15   Hillary Bow, MD  sulfamethoxazole-trimethoprim (BACTRIM DS,SEPTRA DS) 800-160 MG tablet Take 1 tablet by mouth 2 (two) times daily. Patient not taking: Reported on 08/18/2015 08/17/15   Hillary Bow, MD  tamsulosin (FLOMAX) 0.4 MG CAPS capsule Take 1 capsule (0.4 mg total) by mouth daily. Patient not taking: Reported on 08/18/2015 08/17/15   Hillary Bow, MD      VITAL SIGNS:  Blood pressure 135/78, pulse 100, temperature 100.3 F (37.9 C), temperature source Oral, resp. rate 13, weight 75.751 kg (167 lb), SpO2 96 %.  PHYSICAL EXAMINATION:   Physical Exam  Constitutional: He is well-developed, well-nourished, and in no distress. No distress.  HENT:  Head: Normocephalic.  Eyes: No scleral icterus.  Neck: Normal range of motion. Neck supple. No JVD present. No tracheal deviation present.  Cardiovascular: Normal rate, regular rhythm and normal heart sounds.  Exam reveals no gallop and no friction rub.   No murmur heard. Pulmonary/Chest: Effort  normal and breath sounds normal. No respiratory distress. He has no wheezes. He has no rales. He exhibits no tenderness.  Abdominal: Soft. Bowel sounds are normal. He exhibits no distension and no mass. There is no tenderness. There is no rebound and no guarding.  Musculoskeletal: Normal range of motion. He exhibits no edema.   Neurological: He is alert.  Skin: Skin is warm. No rash noted. No erythema.  Psychiatric: Affect normal.      LABORATORY PANEL:   CBC  Recent Labs Lab 08/17/15 0459  WBC 12.3*  HGB 11.5*  HCT 34.4*  PLT 156   ------------------------------------------------------------------------------------------------------------------  Chemistries   Recent Labs Lab 08/15/15 1531  08/17/15 0459  NA 136  < > 137  K 3.4*  < > 3.4*  CL 99*  < > 108  CO2 26  < > 24  GLUCOSE 112*  < > 106*  BUN 19  < > 14  CREATININE 1.12  < > 0.97  CALCIUM 8.6*  < > 7.9*  MG 1.7  --   --   < > = values in this interval not displayed. ------------------------------------------------------------------------------------------------------------------  Cardiac Enzymes No results for input(s): TROPONINI in the last 168 hours. ------------------------------------------------------------------------------------------------------------------  RADIOLOGY:  Dg Chest 1 View  08/18/2015  CLINICAL DATA:  Sepsis. EXAM: CHEST 1 VIEW COMPARISON:  04/28/2015. FINDINGS: The cardiac silhouette remains borderline enlarged. Minimal patchy opacity at the left lung base. Otherwise, clear lungs. Diffuse osteopenia. IMPRESSION: Minimal patchy atelectasis or pneumonia at the left lung base. Electronically Signed   By: Claudie Revering M.D.   On: 08/18/2015 18:26    EKG:   Sinus tachycardia no ST elevation  MPRESSION AND PLAN:   82 year old male diagnosed with Klebsiella urinary tract infection sensitive to Bactrim and urinary retention status post Foley catheter placement on most recent hospitalization and discharge yesterday presented with fever from skilled nursing facility.  1.HCAP: Chest x-ray shows atelectasis versus pneumonia. Patient will be treated for pneumonia due to fever. I have started Zosyn. Follow up on blood culture. Start incentive spirometry.  2. Klebsiella urinary tract infection: This is sensitive to  Zosyn which will be continued.   3. Urinary retention: Patient has a Foley catheter that was placed during last hospital physician. Continue Flomax  4. Hypokalemia: Replete and recheck in a.m.  5. Essential hypertension: Continue Norvasc  6. Dementia: Stable this time  All the records are reviewed and case discussed with ED provider. Management plans discussed with the patient and he in agreement  CODE STATUS: DNR  TOTAL TIME TAKING CARE OF THIS PATIENT: 50 minutes.    Lunetta Marina M.D on 08/18/2015 at 6:41 PM  Between 7am to 6pm - Pager - [phone]  After 6pm go to [email or web address] - password [email or web address] [facility]  Office  [phone]  CC: Primary care physician; catherine bliss

## 2015-08-18 NOTE — Progress Notes (Signed)
Pharmacy Antibiotic Note  Jacob Wade is a 82 y.o. male admitted on 08/18/2015 with pneumonia.  Pharmacy has been consulted for piperacillin/tazobacam dosing.  Plan: Piperacillin/tazobactam 3.375 g IV q8h EI  Weight: 167 lb (75.751 kg)  Temp (24hrs), Avg:99.4 F (37.4 C), Min:98.5 F (36.9 C), Max:100.3 F (37.9 C)   Recent Labs Lab 08/15/15 1531 08/15/15 2052 08/16/15 0336 08/17/15 0459 08/18/15 1756  WBC 21.9*  --  17.4* 12.3* 14.7*  CREATININE 1.12  --  0.86 0.97 1.49*  LATICACIDVEN 3.8* 1.4  --   --  1.4    Estimated Creatinine Clearance: 32.5 mL/min (by C-G formula based on Cr of 1.49).    Allergies  Allergen Reactions  . Cephalosporins Other (See Comments)    Reaction:  Tremors   . Ciprofloxacin Other (See Comments)    Reaction:  Unknown    Antimicrobials this admission: Piperacillin/tazobactam 5/26 >>   Dose adjustments this admission:  Microbiology results: 5/26 BCx: Sent 5/26 UCx: Sent   Thank you for allowing pharmacy to be a part of this patient's care.  Lenis Noon, PharmD Clinical Pharmacist 08/18/2015 8:10 PM

## 2015-08-18 NOTE — ED Notes (Signed)
Pt d/c'd from [facility] yesterday after admission for septic uti. Per staff at [facility] pt has been having weakness since this am and has also been having fever and confusion. Tylenol given at 1600.

## 2015-08-18 NOTE — ED Provider Notes (Signed)
[facility] Emergency Department Provider Note  ____________________________________________  Time seen: Patient seen upon arrival to the emergency department  I have reviewed the triage vital signs and the nursing notes.   HISTORY  Chief Complaint Fever; Weakness; and Urinary Tract Infection   HPI Jacob Wade is a 82 y.o. male with a history of dementia and recent sepsis from UTI who is presenting to the emergency department with fever. He was brought in by EMS who recorded a temperature above 102 oral. They said that he did have 1 g of Tylenol prior to transport. The patient is not reporting any pain at this time. Reviewed the patient's records and he had a Foley placed due to urinary retention upon admission previously. Was found to have Klebsiella in his urine.   Past Medical History  Diagnosis Date  . Dementia   . Hypertension   . Chronic kidney disease     Patient Active Problem List   Diagnosis Date Noted  . Sepsis ([place]) 08/15/2015  . UTI (lower urinary tract infection) 08/15/2015  . Urinary obstruction 08/15/2015    History reviewed. No pertinent past surgical history.  Current Outpatient Rx  Name  Route  Sig  Dispense  Refill  . amLODipine (NORVASC) 10 MG tablet   Oral   Take 1 tablet (10 mg total) by mouth daily.   30 tablet   0   . sulfamethoxazole-trimethoprim (BACTRIM DS,SEPTRA DS) 800-160 MG tablet   Oral   Take 1 tablet by mouth 2 (two) times daily.   6 tablet   0   . tamsulosin (FLOMAX) 0.4 MG CAPS capsule   Oral   Take 1 capsule (0.4 mg total) by mouth daily.   30 capsule   0     Allergies Cephalosporins and Ciprofloxacin  Family History  Problem Relation Age of Onset  . CAD Father   . Lung cancer Brother     Social History Social History  Substance Use Topics  . Smoking status: Former Research scientist (life sciences)  . Smokeless tobacco: None  . Alcohol Use: 0.6 oz/week    1 Cans of beer per week    Review of  Systems Constitutional: No fever/chills Eyes: No visual changes. ENT: No sore throat. Cardiovascular: Denies chest pain. Respiratory: Denies shortness of breath. Gastrointestinal: No abdominal pain.  No nausea, no vomiting.  No diarrhea.  No constipation. Genitourinary: Negative for dysuria. Musculoskeletal: Negative for back pain. Skin: Negative for rash. Neurological: Negative for headaches, focal weakness or numbness.  10-point ROS otherwise negative.  ____________________________________________   PHYSICAL EXAM:  VITAL SIGNS: ED Triage Vitals  Enc Vitals Group     BP 08/18/15 1744 106/57 mmHg     Pulse Rate 08/18/15 1742 102     Resp 08/18/15 1742 20     Temp 08/18/15 1742 100.3 F (37.9 C)     Temp Source 08/18/15 1742 Oral     SpO2 08/18/15 1744 94 %     Weight 08/18/15 1742 167 lb (75.751 kg)     Height --      Head Cir --      Peak Flow --      Pain Score 08/18/15 1743 0     Pain Loc --      Pain Edu? --      Excl. in [place]? --     Constitutional: Alert and oriented. Well appearing and in no acute distress. Eyes: Conjunctivae are normal. PERRL. EOMI. Head: Atraumatic. Nose: No congestion/rhinnorhea. Mouth/Throat:  Mucous membranes are moist.  Oropharynx non-erythematous. Neck: No stridor.   Cardiovascular:Tachycardic, regular rhythm. Grossly normal heart sounds.  Good peripheral circulation. Respiratory: Normal respiratory effort.  No retractions. Lungs CTAB. Gastrointestinal: Soft and nontender. No distention.  Genitourinary: Patient with Foley catheter with hazy urine in tubing. Musculoskeletal: No lower extremity tenderness nor edema.  No joint effusions. Neurologic:  Normal speech and language. No gross focal neurologic deficits are appreciated. Skin:  Skin is warm, dry and intact. No rash noted. Psychiatric: Mood and affect are normal. Speech and behavior are normal.  ____________________________________________   LABS (all labs ordered are listed,  but only abnormal results are displayed)  Labs Reviewed  CULTURE, BLOOD (ROUTINE X 2)  CULTURE, BLOOD (ROUTINE X 2)  URINE CULTURE  COMPREHENSIVE METABOLIC PANEL  CBC WITH DIFFERENTIAL/PLATELET  URINALYSIS COMPLETEWITH MICROSCOPIC ([facility] ONLY)  LACTIC ACID, PLASMA  LACTIC ACID, PLASMA   ____________________________________________  EKG  ED ECG REPORT I, Doran Stabler, the attending physician, personally viewed and interpreted this ECG.   Date: 08/18/2015  EKG Time: 1746  Rate: 97  Rhythm: sinus tachycardia  Axis: Normal axis  Intervals:right bundle branch block  ST&T Change: No ST segment elevation. Mild ST depression in V3 with inversion of the T-wave. No other abnormal T-wave inversions. No significant change from EKG done on May 23.  ____________________________________________  RADIOLOGY  Pending chest x-ray ____________________________________________   PROCEDURES  ____________________________________________   INITIAL IMPRESSION / ASSESSMENT AND PLAN / ED COURSE  Pertinent labs & imaging results that were available during my care of the patient were reviewed by me and considered in my medical decision making (see chart for details).  Patient to be readmitted to the hospital for repeat sepsis. Sepsis protocol ordered. Signed out to Dr. Genia Harold will follow-up with the patient's test results. Patient aware of need for readmission to the hospital. Ordered aztreonam and cefepime. Patient has cephalosporin allergy but was able to tolerate cephalosporins during his last admission. ____________________________________________   FINAL CLINICAL IMPRESSION(S) / ED DIAGNOSES  Sepsis. UTI.    NEW MEDICATIONS STARTED DURING THIS VISIT:  New Prescriptions   No medications on file     Note:  This document was prepared using Dragon voice recognition software and may include unintentional dictation errors.    Jacob Pyo, MD 08/18/15 [place]elle Emerald

## 2015-08-19 LAB — BASIC METABOLIC PANEL
Anion gap: 6 (ref 5–15)
BUN: 20 mg/dL (ref 6–20)
CO2: 22 mmol/L (ref 22–32)
CREATININE: 1.24 mg/dL (ref 0.61–1.24)
Calcium: 7.7 mg/dL — ABNORMAL LOW (ref 8.9–10.3)
Chloride: 110 mmol/L (ref 101–111)
GFR calc Af Amer: 58 mL/min — ABNORMAL LOW (ref >60)
GFR calc non Af Amer: 50 mL/min — ABNORMAL LOW (ref >60)
Glucose, Bld: 115 mg/dL — ABNORMAL HIGH (ref 65–99)
Potassium: 3.7 mmol/L (ref 3.5–5.1)
SODIUM: 138 mmol/L (ref 135–145)

## 2015-08-19 LAB — CBC
HEMATOCRIT: 35.2 % — AB (ref 40.0–52.0)
HEMOGLOBIN: 11.8 g/dL — AB (ref 13.0–18.0)
MCH: 30.3 pg (ref 26.0–34.0)
MCHC: 33.4 g/dL (ref 32.0–36.0)
MCV: 90.8 fL (ref 80.0–100.0)
Platelets: 173 10*3/uL (ref 150–440)
RBC: 3.88 MIL/uL — ABNORMAL LOW (ref 4.40–5.90)
RDW: 14.4 % (ref 11.5–14.5)
WBC: 11.1 10*3/uL — ABNORMAL HIGH (ref 3.8–10.6)

## 2015-08-19 LAB — URINE CULTURE: Culture: <10000 — AB

## 2015-08-19 NOTE — Clinical Social Work Note (Signed)
Clinical Social Work Assessment  Patient Details  Name: Jacob Wade MRN: [id number] Date of Birth: 08-13-1923  Date of referral:  08/19/15               Reason for consult:  Facility Placement                Permission sought to share information with:    Permission granted to share information::   (patient with dementia and very forgetful)  Name::        Agency::     Relationship::     Contact Information:     Housing/Transportation Living arrangements for the past 2 months:  [place], Charity fundraiser of Information:    Patient Interpreter Needed:  None Criminal Activity/Legal Involvement Pertinent to Current Situation/Hospitalization:  No - Comment as needed Significant Relationships:  Adult Children Lives with:    Do you feel safe going back to the place where you live?    Need for family participation in patient care:  Yes (Comment)  Care giving concerns:  Patient recently discharged 48 hours ago to [facility] under private pay.   Social Worker assessment / plan:  CSW worked with patient and patient's daughter: Webb Silversmith last week during initial hospitalization. Patient and his wife are both [place] residents and typically reside in the independent apartments. Patient's daughter wanted patient to go to the healthcare building but there was no skillable need at the time and thus patient went to the healthcare building under private pay (patient also did not have a 3 night inpatient hospitalization last time). Patient currently readmitted and will need to follow to see what needs he may have. Patient will return to the [place] but may can discharge with a skilled need this time but currently it is unclear.     Employment status:  Retired Forensic scientist:  Medicare PT Recommendations:  Not assessed at this time Information / Referral to community resources:     Patient/Family's Response to care:  CSW has not  yet spoken to daughter on this admission.  Patient/Family's Understanding of and Emotional Response to Diagnosis, Current Treatment, and Prognosis:  CSW has not yet spoken to daughter on this admission.  Emotional Assessment Appearance:  Appears stated age Attitude/Demeanor/Rapport:   (pleasant) Affect (typically observed):  Calm Orientation:    Alcohol / Substance use:  Not Applicable Psych involvement (Current and /or in the community):  No (Comment)  Discharge Needs  Concerns to be addressed:  Care Coordination Readmission within the last 30 days:  Yes Current discharge risk:  None Barriers to Discharge:  No Barriers Identified   Shela Leff, LCSW 08/19/2015, 12:13 PM

## 2015-08-19 NOTE — Progress Notes (Signed)
Pt instructed on the incentive spirometer.  He had difficulty using the IS.

## 2015-08-19 NOTE — Progress Notes (Signed)
[place] at [place] NAME: Jacob Wade    MR#:  [id number]  DATE OF BIRTH:  01-28-1924  SUBJECTIVE:  CHIEF COMPLAINT:   Chief Complaint  Patient presents with  . Fever  . Weakness  . Urinary Tract Infection  The patient is a 82 year old Caucasian male with past medical history significant for history of dementia, CK D, essential hypertension, who presented to the hospital with complaints of weakness, lethargy, fever. Apparently patient was just discharged yesterday after being diagnosed with klebsiella UTI, he was initiated on Bactrim and discharged home, only to return back with lethargy and fever to 100.3. In emergency room. On arrival to emergency room, he was also noted to have right lower lobe atelectasis versus pneumonia. Patient denies any cough. He denies any other symptoms, however, admits of not remembering a lot.  Review of Systems  Unable to perform ROS: dementia    VITAL SIGNS: Blood pressure 122/52, pulse 92, temperature 98.4 F (36.9 C), temperature source Oral, resp. rate 18, height 5\' 8"  (1.727 m), weight 75.751 kg (167 lb), SpO2 95 %.  PHYSICAL EXAMINATION:   GENERAL:  82 y.o.-year-old patient lying in the bed with no acute distress.  EYES: Pupils equal, round, reactive to light and accommodation. No scleral icterus. Extraocular muscles intact.  HEENT: Head atraumatic, normocephalic. Oropharynx and nasopharynx clear.  NECK:  Supple, no jugular venous distention. No thyroid enlargement, no tenderness.  LUNGS: Normal breath sounds bilaterally, no wheezing, rales,rhonchi or crepitation. No use of accessory muscles of respiration.  CARDIOVASCULAR: S1, S2 normal. No murmurs, rubs, or gallops.  ABDOMEN: Soft, nontender, nondistended. Bowel sounds present. No organomegaly or mass.  EXTREMITIES: No pedal edema, cyanosis, or clubbing.  NEUROLOGIC: Cranial nerves II through XII are intact. Muscle strength 5/5 in all  extremities. Sensation intact. Gait not checked.  PSYCHIATRIC: The patient is somnolent, but awakened easily with verbal stimulation, able to answer questions, demented, oriented to self  SKIN: No obvious rash, lesion, or ulcer.   ORDERS/RESULTS REVIEWED:   CBC  Recent Labs Lab 08/15/15 1531 08/16/15 0336 08/17/15 0459 08/18/15 1756 08/19/15 0424  WBC 21.9* 17.4* 12.3* 14.7* 11.1*  HGB 14.1 12.5* 11.5* 11.9* 11.8*  HCT 43.1 37.3* 34.4* 35.6* 35.2*  PLT 171 157 156 186 173  MCV 90.1 89.9 91.0 90.4 90.8  MCH 29.6 30.0 30.5 30.2 30.3  MCHC 32.8 33.4 33.5 33.4 33.4  RDW 14.6* 14.6* 14.3 13.8 14.4  LYMPHSABS  --   --  1.0 0.1*  --   MONOABS  --   --  1.0 0.9  --   EOSABS  --   --  0.0 0.1  --   BASOSABS  --   --  0.0 0.1  --    ------------------------------------------------------------------------------------------------------------------  Chemistries   Recent Labs Lab 08/15/15 1531 08/16/15 0336 08/17/15 0459 08/18/15 1756 08/19/15 0424  NA 136 141 137 135 138  K 3.4* 3.4* 3.4* 3.6 3.7  CL 99* 111 108 105 110  CO2 26 24 24 23 22   GLUCOSE 112* 104* 106* 126* 115*  BUN 19 15 14  24* 20  CREATININE 1.12 0.86 0.97 1.49* 1.24  CALCIUM 8.6* 8.0* 7.9* 8.0* 7.7*  MG 1.7  --   --   --   --   AST  --   --   --  16  --   ALT  --   --   --  11*  --   ALKPHOS  --   --   --  48  --   BILITOT  --   --   --  0.3  --    ------------------------------------------------------------------------------------------------------------------ estimated creatinine clearance is 39.1 mL/min (by C-G formula based on Cr of 1.24). ------------------------------------------------------------------------------------------------------------------ No results for input(s): TSH, T4TOTAL, T3FREE, THYROIDAB in the last 72 hours.  Invalid input(s): FREET3  Cardiac Enzymes No results for input(s): CKMB, TROPONINI, MYOGLOBIN in the last 168 hours.  Invalid input(s):  CK ------------------------------------------------------------------------------------------------------------------ Invalid input(s): POCBNP ---------------------------------------------------------------------------------------------------------------  RADIOLOGY: Dg Chest 1 View  08/18/2015  CLINICAL DATA:  Sepsis. EXAM: CHEST 1 VIEW COMPARISON:  04/28/2015. FINDINGS: The cardiac silhouette remains borderline enlarged. Minimal patchy opacity at the left lung base. Otherwise, clear lungs. Diffuse osteopenia. IMPRESSION: Minimal patchy atelectasis or pneumonia at the left lung base. Electronically Signed   By: Claudie Revering M.D.   On: 08/18/2015 18:26    EKG:  Orders placed or performed during the hospital encounter of 08/15/15  . EKG 12-Lead  . EKG 12-Lead  . EKG 12-Lead  . EKG 12-Lead    ASSESSMENT AND PLAN:  Active Problems:   Pneumonia #1. Sepsis due to urinary tract infection, Klebsiella oxytoca, blood, urinary cultures are pending, continue Zosyn, adjust antibiotics depending on culture results #1. Klebsiella oxytoca urinary tract infection, continue patient on Zosyn for now, following clinically, awaiting for repeated blood cultures, urinary cultures, patient's white blood cell count is improving. Likely transition back to Septra DS when patient is clinically stable #2. Acute renal insufficiency, likely dehydration related, improved on IV fluid administration. Getting bladder scan, postvoidal.    #3. Generalized weakness, likely due to infection, dehydration, getting physical therapist involved for further recommendations  #4. Leukocytosis, improving, awaiting for culture results    Management plans discussed with the patient, family and they are in agreement.   DRUG ALLERGIES:  Allergies  Allergen Reactions  . Cephalosporins Other (See Comments)    Reaction:  Tremors   . Ciprofloxacin Other (See Comments)    Reaction:  Unknown     CODE STATUS:     Code Status Orders         Start     Ordered   08/18/15 2102  Full code   Continuous     08/18/15 2101    Code Status History    Date Active Date Inactive Code Status Order ID Comments User Context   08/18/2015  6:38 PM 08/18/2015  9:01 PM DNR [id number]  Bettey Costa, MD ED   08/15/2015  8:13 PM 08/17/2015  7:37 PM DNR [id number]  Vaughan Basta, MD Inpatient    Advance Directive Documentation        Most Recent Value   Type of Advance Directive  Healthcare Power of [place], Living will   Pre-existing out of facility DNR order (yellow form or pink MOST form)     "MOST" Form in Place?        TOTAL TIME TAKING CARE OF THIS PATIENT: 40 minutes.    Theodoro Grist M.D on 08/19/2015 at 2:04 PM  Between 7am to 6pm - Pager - [phone]  After 6pm go to [email or web address] - password [email or web address] [facility]  [place] [facility]  Office  [phone]  CC: Primary care physician; No primary care provider on file.

## 2015-08-19 NOTE — NC FL2 (Signed)
Jacob Wade LEVEL OF CARE SCREENING TOOL     IDENTIFICATION  Patient Name: Jacob Wade Birthdate: 11-18-1923 Sex: male Admission Date (Current Location): 08/18/2015  Jacob Wade and Jacob Wade Number:  Engineering geologist and Address:  [facility], [address] [place], [place] [address]      Provider Number: [id number]  Attending Physician Name and Address:  Jacob Grist, MD  Relative Name and Phone Number:       Current Level of Care: Hospital Recommended Level of Care: [place] Prior Approval Number:    Date Approved/Denied:   PASRR Number: [id number]  Discharge Plan: SNF    Current Diagnoses: Patient Active Problem List   Diagnosis Date Noted  . Pneumonia 08/18/2015  . Sepsis ([place]) 08/15/2015  . UTI (lower urinary tract infection) 08/15/2015  . Urinary obstruction 08/15/2015    Orientation RESPIRATION BLADDER Height & Weight     Self  Normal Indwelling catheter Weight: 167 lb (75.751 kg) Height:  5\' 8"  (172.7 cm)  BEHAVIORAL SYMPTOMS/MOOD NEUROLOGICAL BOWEL NUTRITION STATUS   (attempts to get up out of bed)  (none) Continent Diet (regular)  AMBULATORY STATUS COMMUNICATION OF NEEDS Skin   Supervision Verbally Normal                       Personal Care Assistance Level of Assistance  Bathing, Dressing Bathing Assistance: Limited assistance   Dressing Assistance: Limited assistance     Functional Limitations Info  Hearing   Hearing Info: Impaired      SPECIAL CARE FACTORS FREQUENCY                       Contractures Contractures Info: Not present    Additional Factors Info  Code Status, Allergies Code Status Info: full Allergies Info: cephalosporines, cipro           Current Medications (08/19/2015):  This is the current hospital active medication list Current Facility-Administered Medications  Medication Dose Route Frequency Provider Last Rate Last Dose  . 0.9 %  sodium  chloride infusion   Intravenous Continuous Jacob Costa, MD 75 mL/hr at 08/18/15 2153    . acetaminophen (TYLENOL) tablet 650 mg  650 mg Oral Q6H PRN Jacob Costa, MD       Or  . acetaminophen (TYLENOL) suppository 650 mg  650 mg Rectal Q6H PRN Jacob Costa, MD      . amLODipine (NORVASC) tablet 10 mg  10 mg Oral Daily Jacob Costa, MD   10 mg at 08/19/15 0917  . enoxaparin (LOVENOX) injection 40 mg  40 mg Subcutaneous Q24H Jacob Costa, MD   40 mg at 08/18/15 2153  . ondansetron (ZOFRAN) tablet 4 mg  4 mg Oral Q6H PRN Jacob Costa, MD       Or  . ondansetron (ZOFRAN) injection 4 mg  4 mg Intravenous Q6H PRN Jacob Mody, MD      . piperacillin-tazobactam (ZOSYN) IVPB 3.375 g  3.375 g Intravenous [address] Jacob Wade, RPH   3.375 g at 08/19/15 0552  . potassium chloride SA (K-DUR,KLOR-CON) CR tablet 40 mEq  40 mEq Oral Once Jacob Costa, MD      . senna-docusate (Senokot-S) tablet 1 tablet  1 tablet Oral QHS PRN Jacob Costa, MD      . tamsulosin (FLOMAX) capsule 0.4 mg  0.4 mg Oral Daily Jacob Costa, MD   0.4 mg at 08/19/15 [id number]     Discharge Medications:  Please see discharge summary for a list of discharge medications.  Relevant Imaging Results:  Relevant Lab Results:   Additional Information    Jacob Leff, LCSW

## 2015-08-20 LAB — CULTURE, BLOOD (ROUTINE X 2)
CULTURE: NO GROWTH
Culture: NO GROWTH

## 2015-08-20 MED ORDER — SULFAMETHOXAZOLE-TRIMETHOPRIM 800-160 MG PO TABS
1.0000 | ORAL_TABLET | Freq: Two times a day (BID) | ORAL | Status: DC
  Administered 2015-08-20 – 2015-08-22 (×6): 1 via ORAL
  Filled 2015-08-20 (×6): qty 1

## 2015-08-20 NOTE — Care Management Important Message (Signed)
Important Message  Patient Details  Name: NICKOLAI ZERBE MRN: [id number] Date of Birth: 1923-04-09   Medicare Important Message Given:  Yes    Elli Groesbeck A, RN 08/20/2015, 12:05 PM

## 2015-08-20 NOTE — Progress Notes (Signed)
Foley discontinued per new orders.  Patient has until 2 am to void.  Needs to be bladder scanned q4 hours starting now.  First bladder scan due at 10:00 pm

## 2015-08-20 NOTE — Progress Notes (Addendum)
[place] at [place] NAME: Jacob Wade    MR#:  [id number]  DATE OF BIRTH:  Aug 18, 1923  SUBJECTIVE:  CHIEF COMPLAINT:   Chief Complaint  Patient presents with  . Fever  . Weakness  . Urinary Tract Infection  The patient is a 82 year old Caucasian male with past medical history significant for history of dementia, CK D, essential hypertension, who presented to the hospital with complaints of weakness, lethargy, fever. Apparently patient was just discharged yesterday after being diagnosed with klebsiella UTI, he was initiated on Bactrim and discharged home, only to return back with lethargy and fever to 100.3.  On arrival to emergency room, he was also noted to have right lower lobe atelectasis . Patient denied cough, Sputum production, shortness of breath. The patient feels satisfactory today. Denies any pain, urinary cultures came back less than 10,000 colony-forming units. No identification will be performed. Patient is of Zosyn, on Septra DS, now. Patient's family is questioning discontinuation of Foley catheter. Patient's daughter, power of attorney for medical care is concerned about patient is returning back to facility and getting dehydrated again, all having worsening infection.      Review of Systems  Unable to perform ROS: dementia    VITAL SIGNS: Blood pressure 135/81, pulse 69, temperature 98.1 F (36.7 C), temperature source Axillary, resp. rate 18, height 5\' 8"  (1.727 m), weight 75.751 kg (167 lb), SpO2 97 %.  PHYSICAL EXAMINATION:   GENERAL:  82 y.o.-year-old patient lying in the bed with no acute distress.  EYES: Pupils equal, round, reactive to light and accommodation. No scleral icterus. Extraocular muscles intact.  HEENT: Head atraumatic, normocephalic. Oropharynx and nasopharynx clear.  NECK:  Supple, no jugular venous distention. No thyroid enlargement, no tenderness.  LUNGS: Normal breath sounds bilaterally, no  wheezing, rales,rhonchi or crepitation. No use of accessory muscles of respiration.  CARDIOVASCULAR: S1, S2 normal. No murmurs, rubs, or gallops.  ABDOMEN: Soft, nontender, nondistended. Bowel sounds present. No organomegaly or mass.  EXTREMITIES: No pedal edema, cyanosis, or clubbing.  NEUROLOGIC: Cranial nerves II through XII are intact. Muscle strength 5/5 in all extremities. Sensation intact. Gait not checked.  PSYCHIATRIC: The patient is somnolent, but awakened easily with verbal stimulation, able to answer questions, demented, oriented to self  SKIN: No obvious rash, lesion, or ulcer.   ORDERS/RESULTS REVIEWED:   CBC  Recent Labs Lab 08/15/15 1531 08/16/15 0336 08/17/15 0459 08/18/15 1756 08/19/15 0424  WBC 21.9* 17.4* 12.3* 14.7* 11.1*  HGB 14.1 12.5* 11.5* 11.9* 11.8*  HCT 43.1 37.3* 34.4* 35.6* 35.2*  PLT 171 157 156 186 173  MCV 90.1 89.9 91.0 90.4 90.8  MCH 29.6 30.0 30.5 30.2 30.3  MCHC 32.8 33.4 33.5 33.4 33.4  RDW 14.6* 14.6* 14.3 13.8 14.4  LYMPHSABS  --   --  1.0 0.1*  --   MONOABS  --   --  1.0 0.9  --   EOSABS  --   --  0.0 0.1  --   BASOSABS  --   --  0.0 0.1  --    ------------------------------------------------------------------------------------------------------------------  Chemistries   Recent Labs Lab 08/15/15 1531 08/16/15 0336 08/17/15 0459 08/18/15 1756 08/19/15 0424  NA 136 141 137 135 138  K 3.4* 3.4* 3.4* 3.6 3.7  CL 99* 111 108 105 110  CO2 26 24 24 23 22   GLUCOSE 112* 104* 106* 126* 115*  BUN 19 15 14  24* 20  CREATININE 1.12 0.86 0.97 1.49* 1.24  CALCIUM 8.6* 8.0* 7.9* 8.0* 7.7*  MG 1.7  --   --   --   --   AST  --   --   --  16  --   ALT  --   --   --  11*  --   ALKPHOS  --   --   --  48  --   BILITOT  --   --   --  0.3  --    ------------------------------------------------------------------------------------------------------------------ estimated creatinine clearance is 39.1 mL/min (by C-G formula based on Cr of  1.24). ------------------------------------------------------------------------------------------------------------------ No results for input(s): TSH, T4TOTAL, T3FREE, THYROIDAB in the last 72 hours.  Invalid input(s): FREET3  Cardiac Enzymes No results for input(s): CKMB, TROPONINI, MYOGLOBIN in the last 168 hours.  Invalid input(s): CK ------------------------------------------------------------------------------------------------------------------ Invalid input(s): POCBNP ---------------------------------------------------------------------------------------------------------------  RADIOLOGY: Dg Chest 1 View  08/18/2015  CLINICAL DATA:  Sepsis. EXAM: CHEST 1 VIEW COMPARISON:  04/28/2015. FINDINGS: The cardiac silhouette remains borderline enlarged. Minimal patchy opacity at the left lung base. Otherwise, clear lungs. Diffuse osteopenia. IMPRESSION: Minimal patchy atelectasis or pneumonia at the left lung base. Electronically Signed   By: Claudie Revering M.D.   On: 08/18/2015 18:26    EKG:  Orders placed or performed during the hospital encounter of 08/15/15  . EKG 12-Lead  . EKG 12-Lead  . EKG 12-Lead  . EKG 12-Lead    ASSESSMENT AND PLAN:  Active Problems:   Pneumonia #1. Sepsis due to urinary tract infection, Klebsiella oxytoca, blood cultures 2 are negative , urinary cultureinsignificant growth, discontinue Zosyn, initiate Septra DS, follow patient's kidney function closely, unable to use ciprofloxacin, which would be ideal choice for this patient due to allergies .  #1. Klebsiella oxytoca urinary tract infection's. Discontinue  Zosyn,  patient's white blood cell count is improving. Start  Septra DS orally, follow patient's kidney function closely  #2. Acute renal insufficiency, likely dehydration related, resolved on  IV fluid administration. . Plan was to discontinue Foley catheter and initiate a voiding trial, however, patient's daughter, power of attorney for medical care,  strongly objected to that, stating that urologist recommended to continue Foley catheter through the end of May. Getting urology consultation to discuss Foley catheter use with family . Prolonged discussion this patient's family about need to initiate voiding trial soon, since patient's urine tract infection is treated, resolved.  #3. Generalized weakness, likely due to infection, dehydration, getting physical therapist involved for further recommendations, Discharge patient to skilled nursing facility when able   #4. Leukocytosis, improving, awaiting for culture results  #5. Suspected BPH, acute urinary retention, patient is initiated on Flomax, to be continued, attempt to withdraw Foley catheter for voiding trial .  #6. Urinary retention, as above, I recommended to withdraw Foley catheter while continuing Flomax for voiding trial in and out catheterization if needed, patient's family objected, getting neurologist to see patient in consultation and discuss with daughter  Management plans discussed with the patient, family and they are in agreement.   DRUG ALLERGIES:  Allergies  Allergen Reactions  . Cephalosporins Other (See Comments)    Reaction:  Tremors   . Ciprofloxacin Other (See Comments)    Reaction:  Unknown     CODE STATUS:     Code Status Orders        Start     Ordered   08/18/15 2102  Full code   Continuous     08/18/15 2101    Code Status History    Date Active  Date Inactive Code Status Order ID Comments User Context   08/18/2015  6:38 PM 08/18/2015  9:01 PM DNR [id number]  Bettey Costa, MD ED   08/15/2015  8:13 PM 08/17/2015  7:37 PM DNR [id number]  Vaughan Basta, MD Inpatient    Advance Directive Documentation        Most Recent Value   Type of Advance Directive  Healthcare Power of [place], Living will   Pre-existing out of facility DNR order (yellow form or pink MOST form)     "MOST" Form in Place?        TOTAL TIME TAKING CARE OF THIS PATIENT: 45 minutes.    Long discussion with his urologist regarding Foley catheter discontinuation, urologist, was advised. The patient has been receiving Flomax for the past 5 days, he is a post void residual was 450 cc, not to significant for elderly man, urinary cultures revealed less than 10,000 colony-forming units, indicating that infection is treated, urologist, the same as me felt that Foley catheter withdrawal in controlled surroundings would be appropriate, time spent approximately 10-15 minutes on discussions with the urologist, in addition to examining patient and discussion with his daughter Theodoro Grist M.D on 08/20/2015 at 1:49 PM  Between 7am to 6pm - Pager - [phone]  After 6pm go to [email or web address] - password [email or web address] [facility]  [place] [facility]  Office  [phone]  CC: Primary care physician; No primary care provider on file.

## 2015-08-21 LAB — BASIC METABOLIC PANEL
ANION GAP: 7 (ref 5–15)
BUN: 23 mg/dL — ABNORMAL HIGH (ref 6–20)
CO2: 24 mmol/L (ref 22–32)
Calcium: 7.9 mg/dL — ABNORMAL LOW (ref 8.9–10.3)
Chloride: 108 mmol/L (ref 101–111)
Creatinine, Ser: 1.11 mg/dL (ref 0.61–1.24)
GFR calc Af Amer: >60 mL/min (ref >60)
GFR calc non Af Amer: 57 mL/min — ABNORMAL LOW (ref >60)
GLUCOSE: 105 mg/dL — AB (ref 65–99)
POTASSIUM: 2.6 mmol/L — AB (ref 3.5–5.1)
Sodium: 139 mmol/L (ref 135–145)

## 2015-08-21 LAB — CBC
HEMATOCRIT: 34.3 % — AB (ref 40.0–52.0)
HEMOGLOBIN: 11.5 g/dL — AB (ref 13.0–18.0)
MCH: 30.4 pg (ref 26.0–34.0)
MCHC: 33.6 g/dL (ref 32.0–36.0)
MCV: 90.5 fL (ref 80.0–100.0)
Platelets: 215 10*3/uL (ref 150–440)
RBC: 3.78 MIL/uL — ABNORMAL LOW (ref 4.40–5.90)
RDW: 14.3 % (ref 11.5–14.5)
WBC: 13.4 10*3/uL — ABNORMAL HIGH (ref 3.8–10.6)

## 2015-08-21 LAB — VITAMIN B12: VITAMIN B 12: 322 pg/mL (ref 180–914)

## 2015-08-21 LAB — MAGNESIUM: MAGNESIUM: 1.4 mg/dL — AB (ref 1.7–2.4)

## 2015-08-21 LAB — BRAIN NATRIURETIC PEPTIDE: B Natriuretic Peptide: 176 pg/mL — ABNORMAL HIGH (ref 0.0–100.0)

## 2015-08-21 LAB — POTASSIUM: Potassium: 4.3 mmol/L (ref 3.5–5.1)

## 2015-08-21 MED ORDER — ALUM & MAG HYDROXIDE-SIMETH 200-200-20 MG/5ML PO SUSP: 30.0000 mL | ORAL | Status: DC | PRN

## 2015-08-21 MED ORDER — POTASSIUM CHLORIDE 10 MEQ/100ML IV SOLN
10.0000 meq | INTRAVENOUS | Status: AC
  Administered 2015-08-21 (×4): 10 meq via INTRAVENOUS
  Filled 2015-08-21 (×4): qty 100

## 2015-08-21 MED ORDER — POTASSIUM CHLORIDE 20 MEQ PO PACK
60.0000 meq | PACK | Freq: Once | ORAL | Status: AC
  Administered 2015-08-21: 60 meq via ORAL
  Filled 2015-08-21: qty 3

## 2015-08-21 MED ORDER — MAGNESIUM SULFATE 4 GM/100ML IV SOLN
4.0000 g | Freq: Once | INTRAVENOUS | Status: AC
  Administered 2015-08-21: 4 g via INTRAVENOUS
  Filled 2015-08-21: qty 100

## 2015-08-21 MED ORDER — MAGNESIUM OXIDE 400 (241.3 MG) MG PO TABS
400.0000 mg | ORAL_TABLET | Freq: Two times a day (BID) | ORAL | Status: AC
  Administered 2015-08-21 (×2): 400 mg via ORAL
  Filled 2015-08-21 (×2): qty 1

## 2015-08-21 MED ORDER — SODIUM CHLORIDE 0.9 % IV SOLN
INTRAVENOUS | Status: DC
  Administered 2015-08-22: 20:00:00 via INTRAVENOUS
  Administered 2015-08-22: 50 mL/h via INTRAVENOUS
  Administered 2015-08-24: 22:00:00 via INTRAVENOUS

## 2015-08-21 MED ORDER — POTASSIUM CHLORIDE 20 MEQ PO PACK
40.0000 meq | PACK | Freq: Four times a day (QID) | ORAL | Status: DC
  Administered 2015-08-21 (×2): 40 meq via ORAL
  Filled 2015-08-21 (×2): qty 2

## 2015-08-21 NOTE — Progress Notes (Signed)
MD notified of critical potassium 2.6. Order 60 mEq K and recheck BNP 1 hour after. Nursing continues to monitor.

## 2015-08-21 NOTE — Progress Notes (Signed)
[facility] [place] NAME: Jacob Wade    MR#:  [id number]  DATE OF BIRTH:  May 08, 1923  SUBJECTIVE:  CHIEF COMPLAINT:   Chief Complaint  Patient presents with  . Fever  . Weakness  . Urinary Tract Infection  The patient is a 82 year old Caucasian male with past medical history significant for history of dementia, CK D, essential hypertension, who presented to the hospital with complaints of weakness, lethargy, fever. Apparently patient was just discharged yesterday after being diagnosed with klebsiella UTI, he was initiated on Bactrim and discharged home, only to return back with lethargy and fever to 100.3.  On arrival to emergency room, he was also noted to have right lower lobe atelectasis . Patient denied cough, Sputum production, shortness of breath. The patient feels satisfactory today. Foley catheter was discontinued yesterday, however had to be replaced due to consistently elevated residuals and intermittent catheterizations due to acute urinary retention.    Review of Systems  Unable to perform ROS: dementia    VITAL SIGNS: Blood pressure 131/68, pulse 84, temperature 98.5 F (36.9 C), temperature source Oral, resp. rate 18, height 5\' 8"  (1.727 m), weight 75.751 kg (167 lb), SpO2 92 %.  PHYSICAL EXAMINATION:   GENERAL:  82 y.o.-year-old patient lying in the bed with no acute distress.  EYES: Pupils equal, round, reactive to light and accommodation. No scleral icterus. Extraocular muscles intact.  HEENT: Head atraumatic, normocephalic. Oropharynx and nasopharynx clear.  NECK:  Supple, no jugular venous distention. No thyroid enlargement, no tenderness.  LUNGS: Normal breath sounds bilaterally, no wheezing, rales,rhonchi or crepitation. No use of accessory muscles of respiration.  CARDIOVASCULAR: S1, S2 normal. No murmurs, rubs, or gallops.  ABDOMEN: Soft, nontender, nondistended. Bowel sounds present. No organomegaly or  mass.  EXTREMITIES: No pedal edema, cyanosis, or clubbing.  NEUROLOGIC: Cranial nerves II through XII are intact. Muscle strength 5/5 in all extremities. Sensation intact. Gait not checked.  PSYCHIATRIC: The patient is somnolent, but awakened easily with verbal stimulation, able to answer questions, demented, oriented to self  SKIN: No obvious rash, lesion, or ulcer.   ORDERS/RESULTS REVIEWED:   CBC  Recent Labs Lab 08/16/15 0336 08/17/15 0459 08/18/15 1756 08/19/15 0424 08/21/15 0439  WBC 17.4* 12.3* 14.7* 11.1* 13.4*  HGB 12.5* 11.5* 11.9* 11.8* 11.5*  HCT 37.3* 34.4* 35.6* 35.2* 34.3*  PLT 157 156 186 173 215  MCV 89.9 91.0 90.4 90.8 90.5  MCH 30.0 30.5 30.2 30.3 30.4  MCHC 33.4 33.5 33.4 33.4 33.6  RDW 14.6* 14.3 13.8 14.4 14.3  LYMPHSABS  --  1.0 0.1*  --   --   MONOABS  --  1.0 0.9  --   --   EOSABS  --  0.0 0.1  --   --   BASOSABS  --  0.0 0.1  --   --    ------------------------------------------------------------------------------------------------------------------  Chemistries   Recent Labs Lab 08/15/15 1531 08/16/15 0336 08/17/15 0459 08/18/15 1756 08/19/15 0424 08/21/15 0439  NA 136 141 137 135 138 139  K 3.4* 3.4* 3.4* 3.6 3.7 2.6*  CL 99* 111 108 105 110 108  CO2 26 24 24 23 22 24   GLUCOSE 112* 104* 106* 126* 115* 105*  BUN 19 15 14  24* 20 23*  CREATININE 1.12 0.86 0.97 1.49* 1.24 1.11  CALCIUM 8.6* 8.0* 7.9* 8.0* 7.7* 7.9*  MG 1.7  --   --   --   --  1.4*  AST  --   --   --  16  --   --   ALT  --   --   --  11*  --   --   ALKPHOS  --   --   --  48  --   --   BILITOT  --   --   --  0.3  --   --    ------------------------------------------------------------------------------------------------------------------ estimated creatinine clearance is 43.6 mL/min (by C-G formula based on Cr of 1.11). ------------------------------------------------------------------------------------------------------------------ No results for input(s): TSH, T4TOTAL,  T3FREE, THYROIDAB in the last 72 hours.  Invalid input(s): FREET3  Cardiac Enzymes No results for input(s): CKMB, TROPONINI, MYOGLOBIN in the last 168 hours.  Invalid input(s): CK ------------------------------------------------------------------------------------------------------------------ Invalid input(s): POCBNP ---------------------------------------------------------------------------------------------------------------  RADIOLOGY: No results found.  EKG:  Orders placed or performed during the hospital encounter of 08/15/15  . EKG 12-Lead  . EKG 12-Lead  . EKG 12-Lead  . EKG 12-Lead    ASSESSMENT AND PLAN:  Active Problems:   Pneumonia #1. Sepsis due to urinary tract infection, Klebsiella oxytoca, blood cultures 2 are negative , urinary culture insignificant growth, continue Septra DS, follow patient's kidney function closely, unable to use ciprofloxacin, which would be ideal choice for this patient due to allergies .  #1. Klebsiella oxytoca urinary tract infection. Patient's white blood cell count is worse today after initiation of Septra DS orally yesterday, follow patient's mother cell count tomorrow morning, could be stress or catheterization related  #2. Acute renal insufficiency, likely dehydration related, resolved on  IV fluid administration. Patient's kidney function remains stable, despite patient's relatively poor oral intake, per patient's daughter #3. Generalized weakness, due to infection, dehydration, awaiting for physical therapist input, however patient will be discharged back to facility , Discharge patient to skilled nursing facility likely tomorrow, if stable  #4. Leukocytosis, worse today, possibly related to sleepiness night due to intermittent bladder catheterizations, recheck in the morning #5. Suspected BPH, acute urinary retention, patient is to continue the on Flomax,  attempt to withdraw Foley catheter failed, Foley catheter was replaced, to be  continued until next attempt, as recommended by urologist #6. Urinary retention, unfortunately withdrawal or Foley catheter failed, patient continues to have urinary retention, Foley catheter was replaced, patient is to continue Flomax, he is to follow up with urologist as outpatient   Management plans discussed with the patient, family and they are in agreement.   DRUG ALLERGIES:  Allergies  Allergen Reactions  . Cephalosporins Other (See Comments)    Reaction:  Tremors   . Ciprofloxacin Other (See Comments)    Reaction:  Unknown     CODE STATUS:     Code Status Orders        Start     Ordered   08/18/15 2102  Full code   Continuous     08/18/15 2101    Code Status History    Date Active Date Inactive Code Status Order ID Comments User Context   08/18/2015  6:38 PM 08/18/2015  9:01 PM DNR [id number]  Bettey Costa, MD ED   08/15/2015  8:13 PM 08/17/2015  7:37 PM DNR [id number]  Vaughan Basta, MD Inpatient    Advance Directive Documentation        Most Recent Value   Type of Advance Directive  Healthcare Power of [place], Living will   Pre-existing out of facility DNR order (yellow form or pink MOST form)     "MOST" Form in Place?        TOTAL TIME TAKING CARE OF THIS PATIENT:  45 minutes.  Discussed this patient's daughter extensively, all questions were answered, she voiced understanding   Rasool Rommel M.D on 08/21/2015 at 3:23 PM  Between 7am to 6pm - Pager - [phone]  After 6pm go to [email or web address] - password [email or web address] [facility]  [facility]  Office  [phone]  CC: Primary care physician; No primary care provider on file.

## 2015-08-21 NOTE — Progress Notes (Addendum)
Clinical Education officer, museum (CSW) contacted Dance movement psychotherapist at [employer]. Per Crystal patient can return to [facility] when stable and his Medicare will cover SNF because he has now met the 3 night inpatient stay criteria. Patient was admitted to inpatient on 08/18/15. Plan is for patient to D/C to [facility] when medically stable. CSW will continue to follow and assist as needed.   Per RN patient's daughter is now requesting to take patient home back to independent living. CSW met with patient and his son JR to discuss D/C plan. Per son they are undecided about what they are going to do and will get back to CSW.   CSW received call from patient's daughter Lelon Frohlich [phone] stating that the plan is for patient to return to [facility].   Blima Rich, LCSW [phone]

## 2015-08-22 ENCOUNTER — Inpatient Hospital Stay: Payer: Medicare Other

## 2015-08-22 ENCOUNTER — Telehealth: Payer: Self-pay | Admitting: Urology

## 2015-08-22 LAB — BASIC METABOLIC PANEL
ANION GAP: 5 (ref 5–15)
BUN: 20 mg/dL (ref 6–20)
CHLORIDE: 104 mmol/L (ref 101–111)
CO2: 25 mmol/L (ref 22–32)
Calcium: 7.7 mg/dL — ABNORMAL LOW (ref 8.9–10.3)
Creatinine, Ser: 1.12 mg/dL (ref 0.61–1.24)
GFR calc Af Amer: >60 mL/min (ref >60)
GFR, EST NON AFRICAN AMERICAN: 56 mL/min — AB (ref >60)
Glucose, Bld: 98 mg/dL (ref 65–99)
POTASSIUM: 4.5 mmol/L (ref 3.5–5.1)
SODIUM: 134 mmol/L — AB (ref 135–145)

## 2015-08-22 LAB — CBC
HCT: 35.2 % — ABNORMAL LOW (ref 40.0–52.0)
HEMOGLOBIN: 11.9 g/dL — AB (ref 13.0–18.0)
MCH: 30 pg (ref 26.0–34.0)
MCHC: 33.8 g/dL (ref 32.0–36.0)
MCV: 88.9 fL (ref 80.0–100.0)
PLATELETS: 218 10*3/uL (ref 150–440)
RBC: 3.96 MIL/uL — AB (ref 4.40–5.90)
RDW: 14.3 % (ref 11.5–14.5)
WBC: 13.4 10*3/uL — AB (ref 3.8–10.6)

## 2015-08-22 LAB — URINALYSIS COMPLETE WITH MICROSCOPIC (ARMC ONLY)
BACTERIA UA: NONE SEEN
Bilirubin Urine: NEGATIVE
Glucose, UA: NEGATIVE mg/dL
Ketones, ur: NEGATIVE mg/dL
Leukocytes, UA: NEGATIVE
NITRITE: NEGATIVE
PH: 5 (ref 5.0–8.0)
PROTEIN: NEGATIVE mg/dL
SQUAMOUS EPITHELIAL / LPF: NONE SEEN
Specific Gravity, Urine: 1.014 (ref 1.005–1.030)

## 2015-08-22 LAB — INFLUENZA PANEL BY PCR (TYPE A & B)
H1N1FLUPCR: NOT DETECTED
INFLAPCR: NEGATIVE
INFLBPCR: NEGATIVE

## 2015-08-22 MED ORDER — SODIUM CHLORIDE 0.9 % IV SOLN
3.0000 g | Freq: Four times a day (QID) | INTRAVENOUS | Status: DC
  Administered 2015-08-22 – 2015-08-25 (×10): 3 g via INTRAVENOUS
  Filled 2015-08-22 (×13): qty 3

## 2015-08-22 NOTE — Progress Notes (Signed)
New order to discontinue bladder scan, foley in place.

## 2015-08-22 NOTE — Telephone Encounter (Signed)
I have never seen this patient or given any advice to date.   He was seen by Dr. Pilar Jarvis as an inpatient for urinary retention. It appears that he recently failed another voiding trial just yesterday.    As far as I can tell, he's still admitted.    It seems to me that his follow-up is very appropriate.  Given his multiple failed voiding trials including just yesterday, I think that allowing the catheter remain in place 1 week is reasonable.  Hollice Espy, MD

## 2015-08-22 NOTE — Progress Notes (Signed)
Per MD patient is not medically stable for D/C today. Plan is for patient to D/C to [facility]. [facility] admissions coordinator at [facility] is aware of above. Clinical Social Worker (CSW) contacted patient's daughter Lelon Frohlich and made her aware of above. CSW will continue to follow and assist as needed.   Blima Rich, LCSW [phone]

## 2015-08-22 NOTE — Progress Notes (Signed)
[facility] [place] NAME: Jacob Wade    MR#:  [id number]  DATE OF BIRTH:  [birth year]  SUBJECTIVE:  CHIEF COMPLAINT:   Chief Complaint  Patient presents with  . Fever  . Weakness  . Urinary Tract Infection  The patient is a 82 year old Caucasian male with past medical history significant for history of dementia, CK D, essential hypertension, who presented to the hospital with complaints of weakness, lethargy, fever. Apparently patient was just discharged one day prior to coming back after being diagnosed with klebsiella UTI, he was initiated on Bactrim and discharged home, only to return back with lethargy and fever to 100.3.  On arrival to emergency room, he was also noted to have right lower lobe atelectasis . Patient denied cough, Sputum production, shortness of breath. The patient feels satisfactory today. Foley catheter was discontinued , however had to be replaced due to consistently elevated residuals and intermittent catheterizations due to acute urinary retention. Fever to 101.2 today in the morning, patient was on Septra DS. No complaints of pain or rashes   Review of Systems  Unable to perform ROS: dementia    VITAL SIGNS: Blood pressure 122/73, pulse 88, temperature 98.6 F (37 C), temperature source Oral, resp. rate 18, height 5\' 8"  (1.727 m), weight 75.751 kg (167 lb), SpO2 94 %.  PHYSICAL EXAMINATION:   GENERAL:  82 y.o.-year-old patient lying in the bed with no acute distress.  EYES: Pupils equal, round, reactive to light and accommodation. No scleral icterus. Extraocular muscles intact.  HEENT: Head atraumatic, normocephalic. Oropharynx and nasopharynx clear.  NECK:  Supple, no jugular venous distention. No thyroid enlargement, no tenderness.  LUNGS: Normal breath sounds bilaterally, no wheezing, rales,rhonchi or crepitation. No use of accessory muscles of respiration.  CARDIOVASCULAR: S1, S2 normal. No murmurs,  rubs, or gallops.  ABDOMEN: Soft, nontender, nondistended. Bowel sounds present. No organomegaly or mass.  EXTREMITIES: No pedal edema, cyanosis, or clubbing.  NEUROLOGIC: Cranial nerves II through XII are intact. Muscle strength 5/5 in all extremities. Sensation intact. Gait not checked.  PSYCHIATRIC: The patient is alert, comfortable, eating breakfast, somewhat flushed in the face,  SKIN: No obvious rash, lesion, or ulcer.   ORDERS/RESULTS REVIEWED:   CBC  Recent Labs Lab 08/17/15 0459 08/18/15 1756 08/19/15 0424 08/21/15 0439 08/22/15 0326  WBC 12.3* 14.7* 11.1* 13.4* 13.4*  HGB 11.5* 11.9* 11.8* 11.5* 11.9*  HCT 34.4* 35.6* 35.2* 34.3* 35.2*  PLT 156 186 173 215 218  MCV 91.0 90.4 90.8 90.5 88.9  MCH 30.5 30.2 30.3 30.4 30.0  MCHC 33.5 33.4 33.4 33.6 33.8  RDW 14.3 13.8 14.4 14.3 14.3  LYMPHSABS 1.0 0.1*  --   --   --   MONOABS 1.0 0.9  --   --   --   EOSABS 0.0 0.1  --   --   --   BASOSABS 0.0 0.1  --   --   --    ------------------------------------------------------------------------------------------------------------------  Chemistries   Recent Labs Lab 08/15/15 1531  08/17/15 0459 08/18/15 1756 08/19/15 0424 08/21/15 0439 08/21/15 1801 08/22/15 0326  NA 136  < > 137 135 138 139  --  134*  K 3.4*  < > 3.4* 3.6 3.7 2.6* 4.3 4.5  CL 99*  < > 108 105 110 108  --  104  CO2 26  < > 24 23 22 24   --  25  GLUCOSE 112*  < > 106* 126* 115* 105*  --  98  BUN 19  < > 14 24* 20 23*  --  20  CREATININE 1.12  < > 0.97 1.49* 1.24 1.11  --  1.12  CALCIUM 8.6*  < > 7.9* 8.0* 7.7* 7.9*  --  7.7*  MG 1.7  --   --   --   --  1.4*  --   --   AST  --   --   --  16  --   --   --   --   ALT  --   --   --  11*  --   --   --   --   ALKPHOS  --   --   --  48  --   --   --   --   BILITOT  --   --   --  0.3  --   --   --   --   < > = values in this interval not  displayed. ------------------------------------------------------------------------------------------------------------------ estimated creatinine clearance is 43.3 mL/min (by C-G formula based on Cr of 1.12). ------------------------------------------------------------------------------------------------------------------ No results for input(s): TSH, T4TOTAL, T3FREE, THYROIDAB in the last 72 hours.  Invalid input(s): FREET3  Cardiac Enzymes No results for input(s): CKMB, TROPONINI, MYOGLOBIN in the last 168 hours.  Invalid input(s): CK ------------------------------------------------------------------------------------------------------------------ Invalid input(s): POCBNP ---------------------------------------------------------------------------------------------------------------  RADIOLOGY: No results found.  EKG:  Orders placed or performed during the hospital encounter of 08/15/15  . EKG 12-Lead  . EKG 12-Lead  . EKG 12-Lead  . EKG 12-Lead    ASSESSMENT AND PLAN:  Active Problems:   Pneumonia #1. Fever of unclear etiology, get chest x-ray, , urine culture, ask ID to see patient in consultation, questionable drug fever. #2. Sepsis due to urinary tract infection, Klebsiella oxytoca, blood cultures 2 are negative , urinary culture insignificant growth, continue Septra DS, follow patient's kidney function closely, unable to use ciprofloxacin due to allergies, which would be ideal choice for this patient  .  #3. Klebsiella oxytoca urinary tract infection. Patient's white blood cell count is stable today after initiation of Septra DS orally , follow patient's white cell count closely  #4. Acute renal insufficiency, likely dehydration related, resolved on  IV fluid administration. Patient's kidney function remains stable, despite patient's relatively poor oral intake, per patient's daughter #5. Generalized weakness, due to infection, dehydration, Discharge patient to skilled  nursing facility when  stable  #6. Leukocytosis, stable from yesterday, follow closely #7. Urinary retention, suspected BPH, acute urinary retention, patient is to continue Flomax,  attempt to withdraw Foley catheter failed, Foley catheter was replaced, to be continued until next attempt, as recommended by urologist, in 1 week Follow-up with urologist as outpatient.    Management plans discussed with the patient, family and they are in agreement.   DRUG ALLERGIES:  Allergies  Allergen Reactions  . Cephalosporins Other (See Comments)    Reaction:  Tremors   . Ciprofloxacin Other (See Comments)    Reaction:  Unknown     CODE STATUS:     Code Status Orders        Start     Ordered   08/18/15 2102  Full code   Continuous     08/18/15 2101    Code Status History    Date Active Date Inactive Code Status Order ID Comments User Context   08/18/2015  6:38 PM 08/18/2015  9:01 PM DNR [id number]  Bettey Costa, MD ED   08/15/2015  8:13 PM 08/17/2015  7:37  PM DNR [id number]  Vaughan Basta, MD Inpatient    Advance Directive Documentation        Most Recent Value   Type of Advance Directive  Healthcare Power of Attorney, Living will   Pre-existing out of facility DNR order (yellow form or pink MOST form)     "MOST" Form in Place?        TOTAL TIME TAKING CARE OF THIS PATIENT: 30 minutes.     Theodoro Grist M.D on 08/22/2015 at 3:10 PM  Between 7am to 6pm - Pager - [phone]  After 6pm go to [email or web address] - password [email or web address] [facility]  [place] [facility]  Office  [phone]  CC: Primary care physician; No primary care provider on file.

## 2015-08-22 NOTE — Care Management Important Message (Signed)
Important Message  Patient Details  Name: Jacob Wade MRN: [id number] Date of Birth: 1923-07-13   Medicare Important Message Given:  Yes    Juliann Pulse A Justiss Gerbino 08/22/2015, 12:40 PM

## 2015-08-22 NOTE — Progress Notes (Signed)
Pharmacy Antibiotic Note  Jacob Wade is a 82 y.o. male admitted on 08/18/2015 with aspiration pneumonia.  Pharmacy has been consulted for Unasyn dosing.  Allergy to cephalosporins. Pt previously on Zosyn.   Plan: Will order Unasyn 3 g IV q6h.   Height: 5\' 8"  (172.7 cm) Weight: 167 lb (75.751 kg) IBW/kg (Calculated) : 68.4  Temp (24hrs), Avg:100 F (37.8 C), Min:98.6 F (37 C), Max:101.2 F (38.4 C)   Recent Labs Lab 08/17/15 0459 08/18/15 1756 08/18/15 2111 08/19/15 0424 08/21/15 0439 08/22/15 0326  WBC 12.3* 14.7*  --  11.1* 13.4* 13.4*  CREATININE 0.97 1.49*  --  1.24 1.11 1.12  LATICACIDVEN  --  1.4 1.1  --   --   --     Estimated Creatinine Clearance: 43.3 mL/min (by C-G formula based on Cr of 1.12).    Allergies  Allergen Reactions  . Cephalosporins Other (See Comments)    Reaction:  Tremors   . Ciprofloxacin Other (See Comments)    Reaction:  Unknown     Antimicrobials this admission:   Dose adjustments this admission:   Microbiology results:   Thank you for allowing pharmacy to be a part of this patient's care.  Rocky Morel 08/22/2015 10:23 PM

## 2015-08-22 NOTE — Progress Notes (Signed)
Patient HOH, bilateral hearing aids, one with dead battery. Daughter phoned x 2 inquiring of when being discharges, no orders at this time.

## 2015-08-22 NOTE — Telephone Encounter (Signed)
Patient's daughter called over the weekend and spoke to our after hours nursing line:  "Patient has Alzheimer's. His white blood count is approaching normal, temp is normal and would like to take catheter out.  Dr. Erlene Quan has said to leave it in because the bladder needs to rest, needs clarification.  Catheter was placed in ED last Tuesday due to urinary retention and urosepsis.  Urologist had instructed to keep catheter in to allow bladder to recover and to improve chances of patient's urine flow to be re-established.  Patient went back to ED on Thursday due to pneumonia and is currently in the hospital.  He was admitted with pneumonia and decreased LOC.  Patient has improved with antibiotic therapy and WBC has improved.  Admitting doctor wants to remove catheter."  Appointment was made for 08/29/2015 with Zara Council.  Patient's daughter called back this morning and stated that her father is in a nursing home and needs to have his catheter removed before he can come home.  She feels that this appointment is too far out and is requesting a sooner appointment.  Please advise on how we should proceed.

## 2015-08-22 NOTE — Consult Note (Signed)
[facility] Infectious Disease     Reason for Consult:Recurrent fever    Referring Physician: Seth Bake Date of Admission:  08/18/2015   Active Problems:   Pneumonia   HPI: Jacob Wade is a 82 y.o. male admitted initially 5/23 then again 5/26 from [facility] with lethargy and fever.  Initially 5/23 he had some weakness and urinary symptoms and in the ED had temp 100.9 and wbc 17. Had Foley placed and UA + TNTC wbc and UCX with Klebsiella. Renal USS was negative. Was seen by urology, started flomax and cath left in at dc.  He was dced back to [facility] on Bactrim but readmitted and has since had feer to 101.2, wbc 13.4.  Initially on zosyn but changed to bactrim 5/28. FU bcx have been negative, CXR with possible L base atelectasis or PNA.   Today he is in room alone and is a poor historian. He cannot recall where he is or why he is here. Does tell me he lives at [facility] and thinks it is 2016 UA has been clearing.   Past Medical History  Diagnosis Date  . Dementia   . Hypertension   . Chronic kidney disease    History reviewed. No pertinent past surgical history. Social History  Substance Use Topics  . Smoking status: Former Research scientist (life sciences)  . Smokeless tobacco: None  . Alcohol Use: 0.6 oz/week    1 Cans of beer per week   Family History  Problem Relation Age of Onset  . CAD Father   . Lung cancer Brother     Allergies:  Allergies  Allergen Reactions  . Cephalosporins Other (See Comments)    Reaction:  Tremors   . Ciprofloxacin Other (See Comments)    Reaction:  Unknown     Current antibiotics: Antibiotics Given (last 72 hours)    Date/Time Action Medication Dose Rate   08/19/15 2203 Given   piperacillin-tazobactam (ZOSYN) IVPB 3.375 g 3.375 g 12.5 mL/hr   08/20/15 0034 Given   piperacillin-tazobactam (ZOSYN) IVPB 3.375 g 3.375 g 12.5 mL/hr   08/20/15 1133 Given   sulfamethoxazole-trimethoprim (BACTRIM DS,SEPTRA DS) 800-160 MG per tablet 1 tablet 1 tablet     08/20/15 2222 Given   sulfamethoxazole-trimethoprim (BACTRIM DS,SEPTRA DS) 800-160 MG per tablet 1 tablet 1 tablet    08/21/15 0830 Given   sulfamethoxazole-trimethoprim (BACTRIM DS,SEPTRA DS) 800-160 MG per tablet 1 tablet 1 tablet    08/21/15 2137 Given   sulfamethoxazole-trimethoprim (BACTRIM DS,SEPTRA DS) 800-160 MG per tablet 1 tablet 1 tablet    08/22/15 1047 Given   sulfamethoxazole-trimethoprim (BACTRIM DS,SEPTRA DS) 800-160 MG per tablet 1 tablet 1 tablet       MEDICATIONS: . amLODipine  10 mg Oral Daily  . enoxaparin (LOVENOX) injection  40 mg Subcutaneous Q24H  . sulfamethoxazole-trimethoprim  1 tablet Oral Q12H  . tamsulosin  0.4 mg Oral Daily    Review of Systems - unable to obtain OBJECTIVE: Temp:  [98.6 F (37 C)-101.2 F (38.4 C)] 98.6 F (37 C) (05/30 0857) Pulse Rate:  [64-96] 96 (05/30 1602) Resp:  [18-19] 18 (05/30 1602) BP: (122-136)/(64-76) 132/64 mmHg (05/30 1602) SpO2:  [90 %-94 %] 90 % (05/30 1602) Physical Exam  Constitutional: He is alert but disoriented. Frail HENT: anicteric Mouth/Throat: Oropharynx is clear and moist. No oropharyngeal exudate.  Cardiovascular: Normal rate, regular rhythm and normal heart sounds. Pulmonary/Chest: Effort normal and breath sounds normal. No respiratory distress. He has no wheezes.  Abdominal: Soft. Bowel sounds  are normal. He exhibits mod distension. There is no tenderness.  Lymphadenopathy:  He has no cervical adenopathy.  Neurological: He is alert but thinks it is 2016 ,cannot recall where he is or why here Skin: Skin is warm and dry. No rash noted. No erythema.  Psychiatric: He has a normal mood and affect. His behavior is normal.   LABS: Results for orders placed or performed during the hospital encounter of 08/18/15 (from the past 48 hour(s))  Basic metabolic panel     Status: Abnormal   Collection Time: 08/21/15  4:39 AM  Result Value Ref Range   Sodium 139 135 - 145 mmol/L   Potassium 2.6 (LL) 3.5 - 5.1  mmol/L    Comment: CRITICAL RESULT CALLED TO, READ BACK BY AND VERIFIED WITH JENNIFER COBLE AT 0519 ON 08/21/15 RWW    Chloride 108 101 - 111 mmol/L   CO2 24 22 - 32 mmol/L   Glucose, Bld 105 (H) 65 - 99 mg/dL   BUN 23 (H) 6 - 20 mg/dL   Creatinine, Ser 1.11 0.61 - 1.24 mg/dL   Calcium 7.9 (L) 8.9 - 10.3 mg/dL   GFR calc non Af Amer 57 (L) >60 mL/min   GFR calc Af Amer >60 >60 mL/min    Comment: (NOTE) The eGFR has been calculated using the CKD EPI equation. This calculation has not been validated in all clinical situations. eGFR's persistently <60 mL/min signify possible Chronic Kidney Disease.    Anion gap 7 5 - 15  CBC     Status: Abnormal   Collection Time: 08/21/15  4:39 AM  Result Value Ref Range   WBC 13.4 (H) 3.8 - 10.6 K/uL   RBC 3.78 (L) 4.40 - 5.90 MIL/uL   Hemoglobin 11.5 (L) 13.0 - 18.0 g/dL   HCT 34.3 (L) 40.0 - 52.0 %   MCV 90.5 80.0 - 100.0 fL   MCH 30.4 26.0 - 34.0 pg   MCHC 33.6 32.0 - 36.0 g/dL   RDW 14.3 11.5 - 14.5 %   Platelets 215 150 - 440 K/uL  Brain natriuretic peptide     Status: Abnormal   Collection Time: 08/21/15  4:39 AM  Result Value Ref Range   B Natriuretic Peptide 176.0 (H) 0.0 - 100.0 pg/mL  Magnesium     Status: Abnormal   Collection Time: 08/21/15  4:39 AM  Result Value Ref Range   Magnesium 1.4 (L) 1.7 - 2.4 mg/dL  Potassium     Status: None   Collection Time: 08/21/15  6:01 PM  Result Value Ref Range   Potassium 4.3 3.5 - 5.1 mmol/L  Vitamin B12     Status: None   Collection Time: 08/21/15  6:01 PM  Result Value Ref Range   Vitamin B-12 322 180 - 914 pg/mL    Comment: (NOTE) This assay is not validated for testing neonatal or myeloproliferative syndrome specimens for Vitamin B12 levels. Performed at [facility]   CBC     Status: Abnormal   Collection Time: 08/22/15  3:26 AM  Result Value Ref Range   WBC 13.4 (H) 3.8 - 10.6 K/uL   RBC 3.96 (L) 4.40 - 5.90 MIL/uL   Hemoglobin 11.9 (L) 13.0 - 18.0 g/dL   HCT 35.2  (L) 40.0 - 52.0 %   MCV 88.9 80.0 - 100.0 fL   MCH 30.0 26.0 - 34.0 pg   MCHC 33.8 32.0 - 36.0 g/dL   RDW 14.3 11.5 - 14.5 %   Platelets  218 150 - 440 K/uL  Basic metabolic panel     Status: Abnormal   Collection Time: 08/22/15  3:26 AM  Result Value Ref Range   Sodium 134 (L) 135 - 145 mmol/L   Potassium 4.5 3.5 - 5.1 mmol/L   Chloride 104 101 - 111 mmol/L   CO2 25 22 - 32 mmol/L   Glucose, Bld 98 65 - 99 mg/dL   BUN 20 6 - 20 mg/dL   Creatinine, Ser 1.12 0.61 - 1.24 mg/dL   Calcium 7.7 (L) 8.9 - 10.3 mg/dL   GFR calc non Af Amer 56 (L) >60 mL/min   GFR calc Af Amer >60 >60 mL/min    Comment: (NOTE) The eGFR has been calculated using the CKD EPI equation. This calculation has not been validated in all clinical situations. eGFR's persistently <60 mL/min signify possible Chronic Kidney Disease.    Anion gap 5 5 - 15  Urinalysis complete, with microscopic ([facility] only)     Status: Abnormal   Collection Time: 08/22/15  3:40 PM  Result Value Ref Range   Color, Urine YELLOW (A) YELLOW   APPearance CLEAR (A) CLEAR   Glucose, UA NEGATIVE NEGATIVE mg/dL   Bilirubin Urine NEGATIVE NEGATIVE   Ketones, ur NEGATIVE NEGATIVE mg/dL   Specific Gravity, Urine 1.014 1.005 - 1.030   Hgb urine dipstick 1+ (A) NEGATIVE   pH 5.0 5.0 - 8.0   Protein, ur NEGATIVE NEGATIVE mg/dL   Nitrite NEGATIVE NEGATIVE   Leukocytes, UA NEGATIVE NEGATIVE   RBC / HPF 0-5 0 - 5 RBC/hpf   WBC, UA 0-5 0 - 5 WBC/hpf   Bacteria, UA NONE SEEN NONE SEEN   Squamous Epithelial / LPF NONE SEEN NONE SEEN   Mucous PRESENT    Hyaline Casts, UA PRESENT    No components found for: ESR, C REACTIVE PROTEIN MICRO: Recent Results (from the past 720 hour(s))  Urine culture     Status: Abnormal   Collection Time: 08/15/15  3:15 PM  Result Value Ref Range Status   Specimen Description URINE, RANDOM  Final   Special Requests NONE  Final   Culture >=100,000 COLONIES/mL KLEBSIELLA OXYTOCA (A)  Final   Report Status 08/17/2015  FINAL  Final   Organism ID, Bacteria KLEBSIELLA OXYTOCA (A)  Final      Susceptibility   Klebsiella oxytoca - MIC*    AMPICILLIN >=32 RESISTANT Resistant     CEFAZOLIN >=64 RESISTANT Resistant     CEFTRIAXONE <=1 SENSITIVE Sensitive     CIPROFLOXACIN <=0.25 SENSITIVE Sensitive     GENTAMICIN <=1 SENSITIVE Sensitive     IMIPENEM <=0.25 SENSITIVE Sensitive     NITROFURANTOIN <=16 SENSITIVE Sensitive     TRIMETH/SULFA <=20 SENSITIVE Sensitive     AMPICILLIN/SULBACTAM 8 SENSITIVE Sensitive     PIP/TAZO <=4 SENSITIVE Sensitive     Extended ESBL NEGATIVE Sensitive     * >=100,000 COLONIES/mL KLEBSIELLA OXYTOCA  Culture, blood (routine x 2)     Status: None   Collection Time: 08/15/15  3:20 PM  Result Value Ref Range Status   Specimen Description BLOOD LEFT HAND  Final   Special Requests BOTTLES DRAWN AEROBIC AND ANAEROBIC 6CC  Final   Culture NO GROWTH 5 DAYS  Final   Report Status 08/20/2015 FINAL  Final  Culture, blood (routine x 2)     Status: None   Collection Time: 08/15/15  3:31 PM  Result Value Ref Range Status   Specimen Description BLOOD  Final  Special Requests NONE  Final   Culture NO GROWTH 5 DAYS  Final   Report Status 08/20/2015 FINAL  Final  MRSA PCR Screening     Status: None   Collection Time: 08/16/15  6:30 AM  Result Value Ref Range Status   MRSA by PCR NEGATIVE NEGATIVE Final    Comment:        The GeneXpert MRSA Assay (FDA approved for NASAL specimens only), is one component of a comprehensive MRSA colonization surveillance program. It is not intended to diagnose MRSA infection nor to guide or monitor treatment for MRSA infections.   Blood Culture (routine x 2)     Status: None (Preliminary result)   Collection Time: 08/18/15  5:55 PM  Result Value Ref Range Status   Specimen Description BLOOD LEFT WRIST  Final   Special Requests BOTTLES DRAWN AEROBIC AND ANAEROBIC [place]  Final   Culture NO GROWTH 4 DAYS  Final   Report Status PENDING  Incomplete   Blood Culture (routine x 2)     Status: None (Preliminary result)   Collection Time: 08/18/15  5:56 PM  Result Value Ref Range Status   Specimen Description BLOOD RIGHT WRIST  Final   Special Requests BOTTLES DRAWN AEROBIC AND ANAEROBIC 8CC  Final   Culture NO GROWTH 4 DAYS  Final   Report Status PENDING  Incomplete  Urine culture     Status: Abnormal   Collection Time: 08/18/15  5:56 PM  Result Value Ref Range Status   Specimen Description URINE, RANDOM  Final   Special Requests NONE  Final   Culture (A)  Final    <10,000 COLONIES/mL INSIGNIFICANT GROWTH Performed at [facility]    Report Status 08/19/2015 FINAL  Final    IMAGING: Dg Chest 1 View  08/18/2015  CLINICAL DATA:  Sepsis. EXAM: CHEST 1 VIEW COMPARISON:  04/28/2015. FINDINGS: The cardiac silhouette remains borderline enlarged. Minimal patchy opacity at the left lung base. Otherwise, clear lungs. Diffuse osteopenia. IMPRESSION: Minimal patchy atelectasis or pneumonia at the left lung base. Electronically Signed   By: Claudie Revering M.D.   On: 08/18/2015 18:26   US Renal  08/15/2015  CLINICAL DATA:  Urinary obstruction EXAM: RENAL / URINARY TRACT ULTRASOUND COMPLETE COMPARISON:  None. FINDINGS: Right Kidney: Length: 11.0 cm. Echogenicity within normal limits. No mass or hydronephrosis visualized. Left Kidney: Length: 10.8 cm. Echogenicity within normal limits. No mass or hydronephrosis visualized. Bladder: Decompressed by Foley catheter. IMPRESSION: No acute abnormality noted. Electronically Signed   By: Inez Catalina M.D.   On: 08/15/2015 18:17    Assessment:   CLEOTHA TSANG is a 82 y.o. male with recent urinary obstruction and Kleb PNA UTI now with foley in place readmitted with fevers despite being on bactrim. Initially fever resolved on broad spectrum abx but recurred after switching back to bactrim. He is intolerant of cipro and cephalospoins.   CXR may show PNA. UTI does seem to be clearing on UA results. Renal  USS was nml. LFTs nml  Recommendations Change bactrim to unasyn to cover for possible PNA (aspiration a potential ?) and the UTI Monitor for recurrence of fever. May need CT if recurs of chest abd pelvis but would given 48 hours of coverage before pursueing further testing  Thank you very much for allowing me to participate in the care of this patient. Please call with questions.   Cheral Marker. Ola Spurr, MD

## 2015-08-22 NOTE — Telephone Encounter (Signed)
Advised patient's daughter and provided the appointment information to her.

## 2015-08-23 LAB — CULTURE, BLOOD (ROUTINE X 2)
CULTURE: NO GROWTH
CULTURE: NO GROWTH

## 2015-08-23 LAB — DIFFERENTIAL
BASOS ABS: 0 10*3/uL (ref 0–0.1)
BASOS PCT: 0 %
Band Neutrophils: 5 %
Blasts: 0 %
EOS ABS: 0.5 10*3/uL (ref 0–0.7)
Eosinophils Relative: 3 %
LYMPHS ABS: 1.1 10*3/uL (ref 1.0–3.6)
Lymphocytes Relative: 7 %
MONO ABS: 0.9 10*3/uL (ref 0.2–1.0)
MYELOCYTES: 3 %
Metamyelocytes Relative: 0 %
Monocytes Relative: 6 %
Neutro Abs: 12.5 10*3/uL — ABNORMAL HIGH (ref 1.4–6.5)
Neutrophils Relative %: 76 %
Other: 0 %
PROMYELOCYTES ABS: 0 %
nRBC: 0 /100 WBC

## 2015-08-23 LAB — CBC
HEMATOCRIT: 33.1 % — AB (ref 40.0–52.0)
HEMOGLOBIN: 11.3 g/dL — AB (ref 13.0–18.0)
MCH: 30.7 pg (ref 26.0–34.0)
MCHC: 34.1 g/dL (ref 32.0–36.0)
MCV: 90 fL (ref 80.0–100.0)
Platelets: 209 10*3/uL (ref 150–440)
RBC: 3.68 MIL/uL — AB (ref 4.40–5.90)
RDW: 14.4 % (ref 11.5–14.5)
WBC: 15 10*3/uL — ABNORMAL HIGH (ref 3.8–10.6)

## 2015-08-23 NOTE — Progress Notes (Signed)
[facility] [place] NAME: Jacob Wade    MR#:  [id number]  DATE OF BIRTH:  [birth year]  SUBJECTIVE:  CHIEF COMPLAINT:   Chief Complaint  Patient presents with  . Fever  . Weakness  . Urinary Tract Infection  The patient is a 82 year old Caucasian male with past medical history significant for history of dementia, CK D, essential hypertension, who presented to the hospital with complaints of weakness, lethargy, fever. Apparently patient was just discharged one day prior to coming back after being diagnosed with klebsiella UTI, he was initiated on Bactrim and discharged home, only to return back with lethargy and fever to 100.3.  On arrival to emergency room, he was also noted to have right lower lobe atelectasis . Patient denied cough, Sputum production, shortness of breath. The patient feels satisfactory today. Foley catheter was discontinued , however had to be replaced due to consistently elevated residuals and intermittent catheterizations due to acute urinary retention. Fever to 100.9 Yesterday in the evening while on Unasyn. Dr. Ola Spurr saw patient in consultation and recommended Unasyn, get CT of abdomen and pelvis. If recurrent fevers after 48 hours of antibiotic therapy. The patient denies any significant discomfort   Review of Systems  Constitutional: Negative for fever, chills and weight loss.  HENT: Negative for congestion.   Eyes: Negative for blurred vision and double vision.  Respiratory: Negative for cough, sputum production, shortness of breath and wheezing.   Cardiovascular: Negative for chest pain, palpitations, orthopnea, leg swelling and PND.  Gastrointestinal: Negative for nausea, vomiting, abdominal pain, diarrhea, constipation and blood in stool.  Genitourinary: Negative for dysuria, urgency, frequency and hematuria.  Musculoskeletal: Negative for falls.  Neurological: Negative for dizziness, tremors, focal  weakness and headaches.  Endo/Heme/Allergies: Does not bruise/bleed easily.  Psychiatric/Behavioral: Negative for depression. The patient does not have insomnia.     VITAL SIGNS: Blood pressure 118/53, pulse 83, temperature 98.9 F (37.2 C), temperature source Oral, resp. rate 20, height 5\' 8"  (1.727 m), weight 75.751 kg (167 lb), SpO2 96 %.  PHYSICAL EXAMINATION:   GENERAL:  82 y.o.-year-old patient lying in the bed with no acute distress.  EYES: Pupils equal, round, reactive to light and accommodation. No scleral icterus. Extraocular muscles intact.  HEENT: Head atraumatic, normocephalic. Oropharynx and nasopharynx clear.  NECK:  Supple, no jugular venous distention. No thyroid enlargement, no tenderness.  LUNGS: Normal breath sounds bilaterally, no wheezing, rales,rhonchi or crepitation. No use of accessory muscles of respiration.  CARDIOVASCULAR: S1, S2 normal. No murmurs, rubs, or gallops.  ABDOMEN: Soft, nontender, nondistended. Bowel sounds present. No organomegaly or mass.  EXTREMITIES: No pedal edema, cyanosis, or clubbing.  NEUROLOGIC: Cranial nerves II through XII are intact. Muscle strength 5/5 in all extremities. Sensation intact. Gait not checked.  PSYCHIATRIC: The patient is alert, comfortable, eating breakfast, somewhat flushed in the face,  SKIN: No obvious rash, lesion, or ulcer.   ORDERS/RESULTS REVIEWED:   CBC  Recent Labs Lab 08/17/15 0459 08/18/15 1756 08/19/15 0424 08/21/15 0439 08/22/15 0326 08/23/15 0521  WBC 12.3* 14.7* 11.1* 13.4* 13.4* 15.0*  HGB 11.5* 11.9* 11.8* 11.5* 11.9* 11.3*  HCT 34.4* 35.6* 35.2* 34.3* 35.2* 33.1*  PLT 156 186 173 215 218 209  MCV 91.0 90.4 90.8 90.5 88.9 90.0  MCH 30.5 30.2 30.3 30.4 30.0 30.7  MCHC 33.5 33.4 33.4 33.6 33.8 34.1  RDW 14.3 13.8 14.4 14.3 14.3 14.4  LYMPHSABS 1.0 0.1*  --   --   --   --  MONOABS 1.0 0.9  --   --   --   --   EOSABS 0.0 0.1  --   --   --   --   BASOSABS 0.0 0.1  --   --   --   --     ------------------------------------------------------------------------------------------------------------------  Chemistries   Recent Labs Lab 08/17/15 0459 08/18/15 1756 08/19/15 0424 08/21/15 0439 08/21/15 1801 08/22/15 0326  NA 137 135 138 139  --  134*  K 3.4* 3.6 3.7 2.6* 4.3 4.5  CL 108 105 110 108  --  104  CO2 24 23 22 24   --  25  GLUCOSE 106* 126* 115* 105*  --  98  BUN 14 24* 20 23*  --  20  CREATININE 0.97 1.49* 1.24 1.11  --  1.12  CALCIUM 7.9* 8.0* 7.7* 7.9*  --  7.7*  MG  --   --   --  1.4*  --   --   AST  --  16  --   --   --   --   ALT  --  11*  --   --   --   --   ALKPHOS  --  48  --   --   --   --   BILITOT  --  0.3  --   --   --   --    ------------------------------------------------------------------------------------------------------------------ estimated creatinine clearance is 43.3 mL/min (by C-G formula based on Cr of 1.12). ------------------------------------------------------------------------------------------------------------------ No results for input(s): TSH, T4TOTAL, T3FREE, THYROIDAB in the last 72 hours.  Invalid input(s): FREET3  Cardiac Enzymes No results for input(s): CKMB, TROPONINI, MYOGLOBIN in the last 168 hours.  Invalid input(s): CK ------------------------------------------------------------------------------------------------------------------ Invalid input(s): POCBNP ---------------------------------------------------------------------------------------------------------------  RADIOLOGY: Dg Chest Port 1 View  08/22/2015  CLINICAL DATA:  Acute onset of fever.  Initial encounter. EXAM: PORTABLE CHEST 1 VIEW COMPARISON:  Chest radiograph performed 08/18/2015 FINDINGS: The lungs are well-aerated. Minimal left basilar opacity may reflect atelectasis or mild pneumonia. There is no evidence of pleural effusion or pneumothorax. The cardiomediastinal silhouette is borderline normal in size. No acute osseous abnormalities are  seen. IMPRESSION: Minimal left basilar opacity may reflect atelectasis or mild pneumonia. Electronically Signed   By: Garald Balding M.D.   On: 08/22/2015 19:35    EKG:  Orders placed or performed during the hospital encounter of 08/15/15  . EKG 12-Lead  . EKG 12-Lead  . EKG 12-Lead  . EKG 12-Lead    ASSESSMENT AND PLAN:  Active Problems:   Pneumonia #1. Fever of unclear etiology, Repeat the chest x-ray 30th of May 2017 revealed minimal left basilar opacity, questionable mild pneumonia , urinalysis was unremarkable, appreciate Dr. Blane Ohara input , he recommends CT of abdomen and pelvis if fever does not subside in 48 hours. White blood cell count is higher today than twice yesterday.  #2. Sepsis , was thought to be due to urinary tract infection, Klebsiella oxytoca, blood cultures 2 are negative , urinary culture insignificant growth, now off Septra DS, on Unasyn to cover possible bacterial infection in lungs, urinary tract infection, although urinary cultures showed insignificant growth, treated  .  #3. Klebsiella oxytoca urinary tract infection, obviously treated since urinary cultures showed insignificant growth.. Patient's white blood cell count is higher today despite introduction of Unasyn, following quite the cell count closely, getting CT of abdomen and pelvis if recurrent fevers, elevated white blood cell count after 48 hours of antibiotic therapy #4. Acute renal insufficiency, likely dehydration related,  resolved on  IV fluid administration. Patient's kidney function remains stable, despite patient's relatively poor oral intake, per patient's daughter #5. Generalized weakness, due to infection, dehydration, recurrent fevers, plan is to discharge patient to skilled nursing facility when  stable  #6. Leukocytosis, worsened since yesterday, follow closely #7. Urinary retention, suspected BPH, acute urinary retention, patient is to continue Flomax,  attempt to withdraw Foley catheter  failed, Foley catheter was replaced, to be continued until next attempt, as recommended by urologist, in 1 week Follow-up with urologist as outpatient.    Management plans discussed with the patient, family and they are in agreement.   DRUG ALLERGIES:  Allergies  Allergen Reactions  . Cephalosporins Other (See Comments)    Reaction:  Tremors   . Ciprofloxacin Other (See Comments)    Reaction:  Unknown     CODE STATUS:     Code Status Orders        Start     Ordered   08/18/15 2102  Full code   Continuous     08/18/15 2101    Code Status History    Date Active Date Inactive Code Status Order ID Comments User Context   08/18/2015  6:38 PM 08/18/2015  9:01 PM DNR [id number]  Bettey Costa, MD ED   08/15/2015  8:13 PM 08/17/2015  7:37 PM DNR [id number]  Vaughan Basta, MD Inpatient    Advance Directive Documentation        Most Recent Value   Type of Advance Directive  Healthcare Power of [place], Living will   Pre-existing out of facility DNR order (yellow form or pink MOST form)     "MOST" Form in Place?        TOTAL TIME TAKING CARE OF THIS PATIENT: 40 minutes.     Theodoro Grist M.D on 08/23/2015 at 4:03 PM  Between 7am to 6pm - Pager - [phone]  After 6pm go to [email or web address] - password [email or web address]  [facility]  Office  [phone]  CC: Primary care physician; No primary care provider on file.

## 2015-08-23 NOTE — Plan of Care (Signed)
Problem: Safety: Goal: Ability to remain free from injury will improve Outcome: Progressing Pt remaining free from fall this shift.  Problem: Pain Managment: Goal: General experience of comfort will improve Outcome: Progressing No complaints of pain this shift.  Problem: Fluid Volume: Goal: Ability to maintain a balanced intake and output will improve Outcome: Progressing Still remain on iv fluids.

## 2015-08-23 NOTE — Progress Notes (Signed)
[place] INFECTIOUS DISE[facility] PROGRESS NOTE Date of Admission:  08/18/2015     ID: Jacob Wade is a 82 y.o. male with fevers, UTI  Active Problems:   Pneumonia   Subjective: Febrile again to 100.9 but reports not feeling it. Currently sitting up at edge of bed, feeding self. Looks very good, denies, feeling feverish, no HA, ST, cough, sob, abd pain, nv,d. Is constipated. Has foley in place,. no skin lesions or rash wbc remains 15. Changed to unasyn from bactrim yesterday. Cultres negative  ROS  11 neg except as per HPI  Medications:  Antibiotics Given (last 72 hours)    Date/Time Action Medication Dose Rate   08/20/15 2222 Given   sulfamethoxazole-trimethoprim (BACTRIM DS,SEPTRA DS) 800-160 MG per tablet 1 tablet 1 tablet    08/21/15 0830 Given   sulfamethoxazole-trimethoprim (BACTRIM DS,SEPTRA DS) 800-160 MG per tablet 1 tablet 1 tablet    08/21/15 2137 Given   sulfamethoxazole-trimethoprim (BACTRIM DS,SEPTRA DS) 800-160 MG per tablet 1 tablet 1 tablet    08/22/15 1047 Given   sulfamethoxazole-trimethoprim (BACTRIM DS,SEPTRA DS) 800-160 MG per tablet 1 tablet 1 tablet    08/22/15 2103 Given   sulfamethoxazole-trimethoprim (BACTRIM DS,SEPTRA DS) 800-160 MG per tablet 1 tablet 1 tablet    08/22/15 2249 Given   Ampicillin-Sulbactam (UNASYN) 3 g in sodium chloride 0.9 % 100 mL IVPB 3 g 100 mL/hr   08/23/15 0433 Given   Ampicillin-Sulbactam (UNASYN) 3 g in sodium chloride 0.9 % 100 mL IVPB 3 g 100 mL/hr   08/23/15 1147 Given   Ampicillin-Sulbactam (UNASYN) 3 g in sodium chloride 0.9 % 100 mL IVPB 3 g 100 mL/hr     . amLODipine  10 mg Oral Daily  . ampicillin-sulbactam (UNASYN) IV  3 g Intravenous Q6H  . enoxaparin (LOVENOX) injection  40 mg Subcutaneous Q24H  . tamsulosin  0.4 mg Oral Daily    Objective: Vital signs in last 24 hours: Temp:  [98.9 F (37.2 C)-100.9 F (38.3 C)] 98.9 F (37.2 C) (05/31 0744) Pulse Rate:  [83-96] 83 (05/31 0744) Resp:  [16-20] 20  (05/31 0744) BP: (118-134)/(53-67) 118/53 mmHg (05/31 0744) SpO2:  [90 %-96 %] 96 % (05/31 0744) Constitutional: He is alert but disoriented. Frail HENT: anicteric Mouth/Throat: Oropharynx is clear and moist. No oropharyngeal exudate.  Cardiovascular: Normal rate, regular rhythm and normal heart sounds. Pulmonary/Chest: Effort normal and breath sounds normal. No respiratory distress. He has no wheezes.  Abdominal: Soft. Bowel sounds are normal. He exhibits mod distension. There is no tenderness.  Lymphadenopathy: He has no cervical adenopathy.  GU foley in place Neurological: He is alert but thinks it is 2016 ,cannot recall where he is or why here Skin: Skin is warm and dry. No rash noted. No erythema.  Psychiatric: He has a normal mood and affect. His behavior is normal.   Lab Results  Recent Labs  08/21/15 0439 08/21/15 1801 08/22/15 0326 08/23/15 0521  WBC 13.4*  --  13.4* 15.0*  HGB 11.5*  --  11.9* 11.3*  HCT 34.3*  --  35.2* 33.1*  NA 139  --  134*  --   K 2.6* 4.3 4.5  --   CL 108  --  104  --   CO2 24  --  25  --   BUN 23*  --  20  --   CREATININE 1.11  --  1.12  --     Microbiology: Results for orders placed or performed during the hospital encounter of  08/18/15  Blood Culture (routine x 2)     Status: None   Collection Time: 08/18/15  5:55 PM  Result Value Ref Range Status   Specimen Description BLOOD LEFT WRIST  Final   Special Requests BOTTLES DRAWN AEROBIC AND ANAEROBIC Petroleum  Final   Culture NO GROWTH 5 DAYS  Final   Report Status 08/23/2015 FINAL  Final  Blood Culture (routine x 2)     Status: None   Collection Time: 08/18/15  5:56 PM  Result Value Ref Range Status   Specimen Description BLOOD RIGHT WRIST  Final   Special Requests BOTTLES DRAWN AEROBIC AND ANAEROBIC 8CC  Final   Culture NO GROWTH 5 DAYS  Final   Report Status 08/23/2015 FINAL  Final  Urine culture     Status: Abnormal   Collection Time: 08/18/15  5:56 PM  Result Value Ref Range  Status   Specimen Description URINE, RANDOM  Final   Special Requests NONE  Final   Culture (A)  Final    <10,000 COLONIES/mL INSIGNIFICANT GROWTH Performed at [facility]    Report Status 08/19/2015 FINAL  Final    Studies/Results: Dg Chest Port 1 View  08/22/2015  CLINICAL DATA:  Acute onset of fever.  Initial encounter. EXAM: PORTABLE CHEST 1 VIEW COMPARISON:  Chest radiograph performed 08/18/2015 FINDINGS: The lungs are well-aerated. Minimal left basilar opacity may reflect atelectasis or mild pneumonia. There is no evidence of pleural effusion or pneumothorax. The cardiomediastinal silhouette is borderline normal in size. No acute osseous abnormalities are seen. IMPRESSION: Minimal left basilar opacity may reflect atelectasis or mild pneumonia. Electronically Signed   By: Garald Balding M.D.   On: 08/22/2015 19:35    Assessment/Plan: Jacob Wade is a 82 y.o. male with recent urinary obstruction and Kleb PNA UTI now with foley in place readmitted with fevers despite being on bactrim. Initially fever resolved on broad spectrum abx but recurred after switching back to bactrim. He is intolerant of cipro and cephalospoins. CXR may show PNA. UTI does seem to be clearing on UA results. Renal USS was nml. LFTs nml on admit.  Changed from bactrim to unasyn 5/30.  Still febrile  Recommendations Continue unasyn to cover for possible PNA (aspiration a potential ?) and the UTI Check diff on CBC from this AM  If febrile again > 100.5 tomorrow will consider  CTof chest abd pelvis but would give  48 hours of coverage before pursueing further testing  Thank you very much for the consult. Will follow with you.  [place], Eduardo Wurth P   08/23/2015, 2:24 PM

## 2015-08-23 NOTE — Progress Notes (Signed)
TC to Dr. Ether Griffins, verbal order to hold r/t BP 118/53

## 2015-08-23 NOTE — Progress Notes (Signed)
Per MD patient is not medically stable for D/C today. Plan is for patient to D/C to [facility] when medically stable. [facility] admissions coordinator at [facility] is aware of above. Clinical Social Worker (CSW) contacted patient's daughter Lelon Frohlich and made her aware of above. CSW will continue to follow and assist as needed.   Blima Rich, LCSW [phone]

## 2015-08-24 LAB — CBC
HEMATOCRIT: 33.4 % — AB (ref 40.0–52.0)
HEMOGLOBIN: 11.2 g/dL — AB (ref 13.0–18.0)
MCH: 29.9 pg (ref 26.0–34.0)
MCHC: 33.4 g/dL (ref 32.0–36.0)
MCV: 89.4 fL (ref 80.0–100.0)
Platelets: 231 10*3/uL (ref 150–440)
RBC: 3.74 MIL/uL — AB (ref 4.40–5.90)
RDW: 14.2 % (ref 11.5–14.5)
WBC: 11.2 10*3/uL — AB (ref 3.8–10.6)

## 2015-08-24 LAB — SODIUM: Sodium: 136 mmol/L (ref 135–145)

## 2015-08-24 NOTE — Progress Notes (Signed)
ID E note No more fevers, continue current treatment

## 2015-08-24 NOTE — Progress Notes (Signed)
[facility] [facility] NAME: Jacob Wade    MR#:  [id number]  DATE OF BIRTH:  November 04, 1923  SUBJECTIVE:  CHIEF COMPLAINT:   Chief Complaint  Patient presents with  . Fever  . Weakness  . Urinary Tract Infection  The patient is a 82 year old Caucasian male with past medical history significant for history of dementia, CK D, essential hypertension, who presented to the hospital with complaints of weakness, lethargy, fever. Apparently patient was just discharged one day prior to coming back after being diagnosed with klebsiella UTI, he was initiated on Bactrim and discharged home, only to return back with lethargy and fever to 100.3.  On arrival to emergency room, he was also noted to have right lower lobe atelectasis . Patient denied cough, Sputum production, shortness of breath. The patient feels satisfactory today. Foley catheter was discontinued , however had to be replaced due to consistently elevated residuals and intermittent catheterizations due to acute urinary retention. Fever to 100.9 Yesterday in the evening while on Unasyn. Dr. Ola Spurr saw patient in consultation and recommended Unasyn, get CT of abdomen and pelvis. If recurrent fevers after 48 hours of antibiotic therapy. The patient denies any significant discomfort. Patient feels comfortable today, no fevers over the past 30-36 hours, patient is being managed on Unasyn, white blood cell count is improving. Patient is celebrating 43 years today!   Review of Systems  Constitutional: Negative for fever, chills and weight loss.  HENT: Negative for congestion.   Eyes: Negative for blurred vision and double vision.  Respiratory: Negative for cough, sputum production, shortness of breath and wheezing.   Cardiovascular: Negative for chest pain, palpitations, orthopnea, leg swelling and PND.  Gastrointestinal: Negative for nausea, vomiting, abdominal pain, diarrhea, constipation and blood  in stool.  Genitourinary: Negative for dysuria, urgency, frequency and hematuria.  Musculoskeletal: Negative for falls.  Neurological: Negative for dizziness, tremors, focal weakness and headaches.  Endo/Heme/Allergies: Does not bruise/bleed easily.  Psychiatric/Behavioral: Negative for depression. The patient does not have insomnia.     VITAL SIGNS: Blood pressure 114/49, pulse 81, temperature 98.6 F (37 C), temperature source Oral, resp. rate 18, height 5\' 8"  (1.727 m), weight 75.751 kg (167 lb), SpO2 94 %.  PHYSICAL EXAMINATION:   GENERAL:  82 y.o.-year-old patient lying in the bed with no acute distress.  EYES: Pupils equal, round, reactive to light and accommodation. No scleral icterus. Extraocular muscles intact.  HEENT: Head atraumatic, normocephalic. Oropharynx and nasopharynx clear.  NECK:  Supple, no jugular venous distention. No thyroid enlargement, no tenderness.  LUNGS: Normal breath sounds bilaterally, no wheezing, rales,rhonchi or crepitation. No use of accessory muscles of respiration.  CARDIOVASCULAR: S1, S2 normal. No murmurs, rubs, or gallops.  ABDOMEN: Soft, nontender, nondistended. Bowel sounds present. No organomegaly or mass.  EXTREMITIES: No pedal edema, cyanosis, or clubbing.  NEUROLOGIC: Cranial nerves II through XII are intact. Muscle strength 5/5 in all extremities. Sensation intact. Gait not checked.  PSYCHIATRIC: The patient is alert, comfortable, eating breakfast, somewhat flushed in the face,  SKIN: No obvious rash, lesion, or ulcer.   ORDERS/RESULTS REVIEWED:   CBC  Recent Labs Lab 08/18/15 1756 08/19/15 0424 08/21/15 0439 08/22/15 0326 08/23/15 0521 08/24/15 0617  WBC 14.7* 11.1* 13.4* 13.4* 15.0* 11.2*  HGB 11.9* 11.8* 11.5* 11.9* 11.3* 11.2*  HCT 35.6* 35.2* 34.3* 35.2* 33.1* 33.4*  PLT 186 173 215 218 209 231  MCV 90.4 90.8 90.5 88.9 90.0 89.4  MCH 30.2 30.3 30.4 30.0  30.7 29.9  MCHC 33.4 33.4 33.6 33.8 34.1 33.4  RDW 13.8 14.4 14.3  14.3 14.4 14.2  LYMPHSABS 0.1*  --   --   --  1.1  --   MONOABS 0.9  --   --   --  0.9  --   EOSABS 0.1  --   --   --  0.5  --   BASOSABS 0.1  --   --   --  0.0  --    ------------------------------------------------------------------------------------------------------------------  Chemistries   Recent Labs Lab 08/18/15 1756 08/19/15 0424 08/21/15 0439 08/21/15 1801 08/22/15 0326 08/24/15 0617  NA 135 138 139  --  134* 136  K 3.6 3.7 2.6* 4.3 4.5  --   CL 105 110 108  --  104  --   CO2 23 22 24   --  25  --   GLUCOSE 126* 115* 105*  --  98  --   BUN 24* 20 23*  --  20  --   CREATININE 1.49* 1.24 1.11  --  1.12  --   CALCIUM 8.0* 7.7* 7.9*  --  7.7*  --   MG  --   --  1.4*  --   --   --   AST 16  --   --   --   --   --   ALT 11*  --   --   --   --   --   ALKPHOS 48  --   --   --   --   --   BILITOT 0.3  --   --   --   --   --    ------------------------------------------------------------------------------------------------------------------ estimated creatinine clearance is 42.4 mL/min (by C-G formula based on Cr of 1.12). ------------------------------------------------------------------------------------------------------------------ No results for input(s): TSH, T4TOTAL, T3FREE, THYROIDAB in the last 72 hours.  Invalid input(s): FREET3  Cardiac Enzymes No results for input(s): CKMB, TROPONINI, MYOGLOBIN in the last 168 hours.  Invalid input(s): CK ------------------------------------------------------------------------------------------------------------------ Invalid input(s): POCBNP ---------------------------------------------------------------------------------------------------------------  RADIOLOGY: Dg Chest Port 1 View  08/22/2015  CLINICAL DATA:  Acute onset of fever.  Initial encounter. EXAM: PORTABLE CHEST 1 VIEW COMPARISON:  Chest radiograph performed 08/18/2015 FINDINGS: The lungs are well-aerated. Minimal left basilar opacity may reflect atelectasis or  mild pneumonia. There is no evidence of pleural effusion or pneumothorax. The cardiomediastinal silhouette is borderline normal in size. No acute osseous abnormalities are seen. IMPRESSION: Minimal left basilar opacity may reflect atelectasis or mild pneumonia. Electronically Signed   By: Garald Balding M.D.   On: 08/22/2015 19:35    EKG:  Orders placed or performed during the hospital encounter of 08/15/15  . EKG 12-Lead  . EKG 12-Lead  . EKG 12-Lead  . EKG 12-Lead    ASSESSMENT AND PLAN:  Active Problems:   Pneumonia #1. Fever of Likely aspiration, per Dr. Ola Spurr, resolved now on Unasyn, Repeated the chest x-ray 30th of May 2017 revealed minimal left basilar opacity, questionable mild pneumonia , urinalysis was unremarkable, appreciate Dr. Blane Ohara input , he recommends CT of abdomen and pelvis if fever is a recurrent . White blood cell count has improved, likely steroid related  #2. Sepsis , was thought to be due to urinary tract infection, Klebsiella oxytoca, blood cultures 2 are negative , urinary culture insignificant growth, now off Septra DS, on Unasyn to cover possible bacterial infection in lungs, urinary tract infection, although urinary cultures showed insignificant growth, treated . Sepsis is resolving, white cell count is improving,  patient is afebrile .  #3. Klebsiella oxytoca urinary tract infection, obviously treated since urinary cultures showed insignificant growth.. Patient's white blood cell count has improved, likely steroid related per Dr. Ola Spurr, getting CT of abdomen and pelvis if recurrent fevers, follow white blood cell count  #4. Acute renal insufficiency, likely dehydration related, resolved on  IV fluid administration. Patient's kidney function remains stable, despite patient's relatively poor oral intake, however, patient remains on low rate IV fluids #5. Generalized weakness, due to infection, dehydration, recurrent fevers, plan is to discharge patient  to skilled nursing facility , likely tomorrow  #6. Leukocytosis, improved #7. Urinary retention, suspected BPH, acute urinary retention, patient is to continue Flomax,  attempt to withdraw Foley catheter failed, Foley catheter was replaced, to be continued until next attempt, as recommended by urologist, in 1 week Follow-up with urologist as outpatient.   #8 dysphagia, speech therapist evaluation was obtained to teach swallowing techniques, appreciate Katherine's input tremendously  Management plans discussed with the patient, family and they are in agreement.   DRUG ALLERGIES:  Allergies  Allergen Reactions  . Cephalosporins Other (See Comments)    Reaction:  Tremors   . Ciprofloxacin Other (See Comments)    Reaction:  Unknown     CODE STATUS:     Code Status Orders        Start     Ordered   08/18/15 2102  Full code   Continuous     08/18/15 2101    Code Status History    Date Active Date Inactive Code Status Order ID Comments User Context   08/18/2015  6:38 PM 08/18/2015  9:01 PM DNR [id number]  Bettey Costa, MD ED   08/15/2015  8:13 PM 08/17/2015  7:37 PM DNR [id number]  Vaughan Basta, MD Inpatient    Advance Directive Documentation        Most Recent Value   Type of Advance Directive  Healthcare Power of [place], Living will   Pre-existing out of facility DNR order (yellow form or pink MOST form)     "MOST" Form in Place?        TOTAL TIME TAKING CARE OF THIS PATIENT: 63minutes.     Theodoro Grist M.D on 08/24/2015 at 2:48 PM  Between 7am to 6pm - Pager - [phone]  After 6pm go to [email or web address] - password [email or web address] [facility]  [place] [facility]  Office  [phone]  CC: Primary care physician; No primary care provider on file.

## 2015-08-25 DIAGNOSIS — R339 Retention of urine, unspecified: Secondary | ICD-10-CM | POA: Diagnosis not present

## 2015-08-25 DIAGNOSIS — R6889 Other general symptoms and signs: Secondary | ICD-10-CM | POA: Diagnosis not present

## 2015-08-25 DIAGNOSIS — F0390 Unspecified dementia without behavioral disturbance: Secondary | ICD-10-CM | POA: Diagnosis not present

## 2015-08-25 DIAGNOSIS — R269 Unspecified abnormalities of gait and mobility: Secondary | ICD-10-CM | POA: Diagnosis not present

## 2015-08-25 DIAGNOSIS — N390 Urinary tract infection, site not specified: Secondary | ICD-10-CM | POA: Diagnosis not present

## 2015-08-25 DIAGNOSIS — E860 Dehydration: Secondary | ICD-10-CM

## 2015-08-25 DIAGNOSIS — N289 Disorder of kidney and ureter, unspecified: Secondary | ICD-10-CM | POA: Diagnosis not present

## 2015-08-25 DIAGNOSIS — R509 Fever, unspecified: Secondary | ICD-10-CM | POA: Diagnosis not present

## 2015-08-25 DIAGNOSIS — N400 Enlarged prostate without lower urinary tract symptoms: Secondary | ICD-10-CM

## 2015-08-25 DIAGNOSIS — G301 Alzheimer's disease with late onset: Secondary | ICD-10-CM

## 2015-08-25 DIAGNOSIS — D72829 Elevated white blood cell count, unspecified: Secondary | ICD-10-CM

## 2015-08-25 DIAGNOSIS — Z7401 Bed confinement status: Secondary | ICD-10-CM | POA: Diagnosis not present

## 2015-08-25 DIAGNOSIS — R4189 Other symptoms and signs involving cognitive functions and awareness: Secondary | ICD-10-CM | POA: Diagnosis not present

## 2015-08-25 DIAGNOSIS — I10 Essential (primary) hypertension: Secondary | ICD-10-CM | POA: Diagnosis not present

## 2015-08-25 DIAGNOSIS — J690 Pneumonitis due to inhalation of food and vomit: Secondary | ICD-10-CM | POA: Diagnosis not present

## 2015-08-25 DIAGNOSIS — M6281 Muscle weakness (generalized): Secondary | ICD-10-CM | POA: Diagnosis not present

## 2015-08-25 DIAGNOSIS — A419 Sepsis, unspecified organism: Secondary | ICD-10-CM | POA: Diagnosis not present

## 2015-08-25 LAB — CREATININE, SERUM
Creatinine, Ser: 1.1 mg/dL (ref 0.61–1.24)
GFR calc Af Amer: >60 mL/min (ref >60)
GFR calc non Af Amer: 57 mL/min — ABNORMAL LOW (ref >60)

## 2015-08-25 MED ORDER — AMOXICILLIN-POT CLAVULANATE 500-125 MG PO TABS: 1.0000 | ORAL_TABLET | Freq: Two times a day (BID) | ORAL | Status: DC

## 2015-08-25 MED ORDER — AMOXICILLIN-POT CLAVULANATE 500-125 MG PO TABS: 1.0000 | ORAL_TABLET | Freq: Three times a day (TID) | ORAL | Status: DC

## 2015-08-25 MED ORDER — SENNOSIDES-DOCUSATE SODIUM 8.6-50 MG PO TABS: 1.0000 | ORAL_TABLET | Freq: Every evening | ORAL | Status: DC | PRN

## 2015-08-25 NOTE — Clinical Social Work Note (Signed)
Pt is ready for discharge today to [facility]. Pt and family are agreeable to discharge plan. Facility has received discharge information and is ready to admit pt. RN will call report and EMS will provide transportation. CSW is signing off as no further needs identified.   Darden Dates, MSW, LCSW  Clinical Social Worker  [phone]

## 2015-08-25 NOTE — Care Management (Signed)
Message left for patient daughter Lelon Frohlich to call RNCM to discuss discharge plan. Order for "discharge to home". If patient discharges home he would benefit from home health and [place]. I have also sent message to Seth Bake at [facility] to see if her plans had changed. RNCM will follow along with CSW. Last disposition was for patient to go to SNF at [facility].

## 2015-08-25 NOTE — Care Management (Signed)
Per Daughter and Seth Bake patient will be going to Health care building at [facility]- not home. Need discharge summary for CSW/[place].

## 2015-08-25 NOTE — Discharge Summary (Signed)
[facility] [place] NAME: Jacob Wade    MR#:  [id number]  DATE OF BIRTH:  08-Oct-1923  DATE OF ADMISSION:  08/18/2015 ADMITTING PHYSICIAN: Bettey Costa, MD  DATE OF DISCHARGE: No discharge date for patient encounter.  PRIMARY CARE PHYSICIAN: No primary care provider on file.     ADMISSION DIAGNOSIS:  UTI (lower urinary tract infection) [N39.0] Sepsis, due to unspecified organism ([place]) [A41.9]  DISCHARGE DIAGNOSIS:  Principal Problem:   Sepsis ([place]) Active Problems:   Aspiration pneumonia (H[facility])   Fever, unspecified   Urinary obstruction   Acute renal insufficiency   Dehydration   Leukocytosis   BPH (benign prostatic hyperplasia)   UTI (lower urinary tract infection)   SECONDARY DIAGNOSIS:   Past Medical History  Diagnosis Date  . Dementia   . Hypertension   . Chronic kidney disease     .pro HOSPITAL COURSE:  The patient is a 82 year old Caucasian male with past medical history significant for history of dementia, CK D, essential hypertension, who presented to the hospital with complaints of weakness, lethargy, fever. Apparently patient was just discharged one day prior to coming back after being diagnosed with klebsiella UTI, he was initiated on Bactrim and discharged home, only to return back with lethargy and fever to 100.3. On arrival to emergency room, he was also noted to have right lower lobe atelectasis . Patient denied cough, Sputum production, shortness of breath. T Foley catheter was discontinued , however had to be replaced due to consistently elevated residuals and intermittent catheterizations due to acute urinary retention. Patient developed recurrent fever to 100.9 in the evening of 08/21/2015 Dr. Ola Spurr saw patient in consultation and recommended Unasyn for possible aspiration pneumonitis, also get CT of abdomen and pelvis if recurrent fevers after 48 hours of antibiotic therapy. Patient did well with  Unasyn and had no recurrent fevers. He was felt to be stable to be discharged to skilled nursing facility for rehabilitation today, 08/25/2015. Unasyn was changed to Augmentin prior to discharge. Discussion by problem: 1. Fever due to aspiration pneumonitis, per Dr. Ola Spurr, resolved now on Unasyn, Repeated the chest x-ray 30th of May 2017 revealed minimal left basilar opacity, questionable mild pneumonia , urinalysis was unremarkable, appreciate Dr. Blane Ohara input , he recommends CT of abdomen and pelvis if fever is a recurrent . White blood cell count has improved, likely steroid related  #2. Sepsis , was thought to be due to urinary tract infection, Klebsiella oxytoca, blood cultures 2 are negative , urinary culture insignificant growth, patient was managed on Unasyn to cover possible bacterial infection in lungs, urinary tract infection, although urinary cultures showed insignificant growth, treated . Sepsis is resolving, white cell count is improving, patient is afebrile .  #3. Klebsiella oxytoca urinary tract infection, obviously treated since urinary cultures showed insignificant growth.. Patient's white blood cell count has improved, likely steroid related per Dr. Ola Spurr, get CT of abdomen and pelvis if recurrent fevers, follow white blood cell count as outpatient. #4. Acute renal insufficiency, likely dehydration related, resolved on IV fluid administration. Patient's kidney function remains stable, it is necessary to follow patient's oral intake as outpatient, especially his liquid intake, check labs intermittently to ensure the patient's is hydrated. #5. Generalized weakness, due to infection, dehydration, recurrent fevers, plan is to discharge patient to skilled nursing facility today #6. Leukocytosis, improved #7. Urinary retention, suspected BPH, acute urinary retention, patient is to continue Flomax, attempt to withdraw Foley catheter failed, Foley catheter  was replaced, to  be continued until next attempt, as recommended by urologist, in 1 week Follow-up with urologist as outpatient.  #8 dysphagia, speech therapist evaluation was obtained to teach swallowing techniques, appreciate Katherine's input , no changes in diet were recommended.  DISCHARGE CONDITIONS:   Stable  CONSULTS OBTAINED:  Treatment Team:  Leonel Ramsay, MD  DRUG ALLERGIES:   Allergies  Allergen Reactions  . Cephalosporins Other (See Comments)    Reaction:  Tremors   . Ciprofloxacin Other (See Comments)    Reaction:  Unknown     DISCHARGE MEDICATIONS:   Current Discharge Medication List    START taking these medications   Details  amoxicillin-clavulanate (AUGMENTIN) 500-125 MG tablet Take 1 tablet (500 mg total) by mouth 2 (two) times daily. Qty: 14 tablet, Refills: 0    senna-docusate (SENOKOT-S) 8.6-50 MG tablet Take 1 tablet by mouth at bedtime as needed for mild constipation. Qty: 30 tablet, Refills: 5      CONTINUE these medications which have NOT CHANGED   Details  amLODipine (NORVASC) 10 MG tablet Take 1 tablet (10 mg total) by mouth daily. Qty: 30 tablet, Refills: 0    tamsulosin (FLOMAX) 0.4 MG CAPS capsule Take 1 capsule (0.4 mg total) by mouth daily. Qty: 30 capsule, Refills: 0      STOP taking these medications     sulfamethoxazole-trimethoprim (BACTRIM DS,SEPTRA DS) 800-160 MG tablet          DISCHARGE INSTRUCTIONS:    Patient is to follow-up with primary care physician, urologist as outpatient  If you experience worsening of your admission symptoms, develop shortness of breath, life threatening emergency, suicidal or homicidal thoughts you must seek medical attention immediately by calling 911 or calling your MD immediately  if symptoms less severe.  You Must read complete instructions/literature along with all the possible adverse reactions/side effects for all the Medicines you take and that have been prescribed to you. Take any new  Medicines after you have completely understood and accept all the possible adverse reactions/side effects.   Please note  You were cared for by a hospitalist during your hospital stay. If you have any questions about your discharge medications or the care you received while you were in the hospital after you are discharged, you can call the unit and asked to speak with the hospitalist on call if the hospitalist that took care of you is not available. Once you are discharged, your primary care physician will handle any further medical issues. Please note that NO REFILLS for any discharge medications will be authorized once you are discharged, as it is imperative that you return to your primary care physician (or establish a relationship with a primary care physician if you do not have one) for your aftercare needs so that they can reassess your need for medications and monitor your lab values.    Today   CHIEF COMPLAINT:   Chief Complaint  Patient presents with  . Fever  . Weakness  . Urinary Tract Infection    HISTORY OF PRESENT ILLNESS:  Deris Postle  is a 82 y.o. male with a known history of dementia, CK D, essential hypertension, who presented to the hospital with complaints of weakness, lethargy, fever. Apparently patient was just discharged one day prior to coming back after being diagnosed with klebsiella UTI, he was initiated on Bactrim and discharged home, only to return back with lethargy and fever to 100.3. On arrival to emergency room, he was also noted to  have right lower lobe atelectasis . Patient denied cough, Sputum production, shortness of breath. T Foley catheter was discontinued , however had to be replaced due to consistently elevated residuals and intermittent catheterizations due to acute urinary retention. Patient developed recurrent fever to 100.9 in the evening of 08/21/2015 Dr. Ola Spurr saw patient in consultation and recommended Unasyn for possible aspiration  pneumonitis, also get CT of abdomen and pelvis if recurrent fevers after 48 hours of antibiotic therapy. Patient did well with Unasyn and had no recurrent fevers. He was felt to be stable to be discharged to skilled nursing facility for rehabilitation today, 08/25/2015. Unasyn was changed to Augmentin prior to discharge. Discussion by problem: 1. Fever due to aspiration pneumonitis, per Dr. Ola Spurr, resolved now on Unasyn, Repeated the chest x-ray 30th of May 2017 revealed minimal left basilar opacity, questionable mild pneumonia , urinalysis was unremarkable, appreciate Dr. Blane Ohara input , he recommends CT of abdomen and pelvis if fever is a recurrent . White blood cell count has improved, likely steroid related  #2. Sepsis , was thought to be due to urinary tract infection, Klebsiella oxytoca, blood cultures 2 are negative , urinary culture insignificant growth, patient was managed on Unasyn to cover possible bacterial infection in lungs, urinary tract infection, although urinary cultures showed insignificant growth, treated . Sepsis is resolving, white cell count is improving, patient is afebrile .  #3. Klebsiella oxytoca urinary tract infection, obviously treated since urinary cultures showed insignificant growth.. Patient's white blood cell count has improved, likely steroid related per Dr. Ola Spurr, get CT of abdomen and pelvis if recurrent fevers, follow white blood cell count as outpatient. #4. Acute renal insufficiency, likely dehydration related, resolved on IV fluid administration. Patient's kidney function remains stable, it is necessary to follow patient's oral intake as outpatient, especially his liquid intake, check labs intermittently to ensure the patient's is hydrated. #5. Generalized weakness, due to infection, dehydration, recurrent fevers, plan is to discharge patient to skilled nursing facility today #6. Leukocytosis, improved #7. Urinary retention, suspected BPH, acute  urinary retention, patient is to continue Flomax, attempt to withdraw Foley catheter failed, Foley catheter was replaced, to be continued until next attempt, as recommended by urologist, in 1 week Follow-up with urologist as outpatient.  #8 dysphagia, speech therapist evaluation was obtained to teach swallowing techniques, appreciate Katherine's input , no changes in diet were recommended.     VITAL SIGNS:  Blood pressure 129/63, pulse 69, temperature 98.2 F (36.8 C), temperature source Axillary, resp. rate 18, height 5\' 8"  (1.727 m), weight 75.751 kg (167 lb), SpO2 97 %.  I/O:   Intake/Output Summary (Last 24 hours) at 08/25/15 0947 Last data filed at 08/25/15 0804  Gross per 24 hour  Intake    100 ml  Output   3425 ml  Net  -3325 ml    PHYSICAL EXAMINATION:  GENERAL:  82 y.o.-year-old patient lying in the bed with no acute distress.  EYES: Pupils equal, round, reactive to light and accommodation. No scleral icterus. Extraocular muscles intact.  HEENT: Head atraumatic, normocephalic. Oropharynx and nasopharynx clear.  NECK:  Supple, no jugular venous distention. No thyroid enlargement, no tenderness.  LUNGS: Normal breath sounds bilaterally, no wheezing, rales,rhonchi or crepitation. No use of accessory muscles of respiration.  CARDIOVASCULAR: S1, S2 normal. No murmurs, rubs, or gallops.  ABDOMEN: Soft, non-tender, non-distended. Bowel sounds present. No organomegaly or mass. Foley catheter is present draining light yellow urine EXTREMITIES: No pedal edema, cyanosis, or clubbing.  NEUROLOGIC: Cranial nerves  II through XII are intact. Muscle strength 5/5 in all extremities. Sensation intact. Gait not checked.  PSYCHIATRIC: The patient is alert and oriented x 3.  SKIN: No obvious rash, lesion, or ulcer.   DATA REVIEW:   CBC  Recent Labs Lab 08/24/15 0617  WBC 11.2*  HGB 11.2*  HCT 33.4*  PLT 231    Chemistries   Recent Labs Lab 08/18/15 1756  08/21/15 0439   08/22/15 0326 08/24/15 0617 08/25/15 0450  NA 135  < > 139  --  134* 136  --   K 3.6  < > 2.6*  < > 4.5  --   --   CL 105  < > 108  --  104  --   --   CO2 23  < > 24  --  25  --   --   GLUCOSE 126*  < > 105*  --  98  --   --   BUN 24*  < > 23*  --  20  --   --   CREATININE 1.49*  < > 1.11  --  1.12  --  1.10  CALCIUM 8.0*  < > 7.9*  --  7.7*  --   --   MG  --   --  1.4*  --   --   --   --   AST 16  --   --   --   --   --   --   ALT 11*  --   --   --   --   --   --   ALKPHOS 48  --   --   --   --   --   --   BILITOT 0.3  --   --   --   --   --   --   < > = values in this interval not displayed.  Cardiac Enzymes No results for input(s): TROPONINI in the last 168 hours.  Microbiology Results  Results for orders placed or performed during the hospital encounter of 08/18/15  Blood Culture (routine x 2)     Status: None   Collection Time: 08/18/15  5:55 PM  Result Value Ref Range Status   Specimen Description BLOOD LEFT WRIST  Final   Special Requests BOTTLES DRAWN AEROBIC AND ANAEROBIC [place]  Final   Culture NO GROWTH 5 DAYS  Final   Report Status 08/23/2015 FINAL  Final  Blood Culture (routine x 2)     Status: None   Collection Time: 08/18/15  5:56 PM  Result Value Ref Range Status   Specimen Description BLOOD RIGHT WRIST  Final   Special Requests BOTTLES DRAWN AEROBIC AND ANAEROBIC 8CC  Final   Culture NO GROWTH 5 DAYS  Final   Report Status 08/23/2015 FINAL  Final  Urine culture     Status: Abnormal   Collection Time: 08/18/15  5:56 PM  Result Value Ref Range Status   Specimen Description URINE, RANDOM  Final   Special Requests NONE  Final   Culture (A)  Final    <10,000 COLONIES/mL INSIGNIFICANT GROWTH Performed at [facility]    Report Status 08/19/2015 FINAL  Final    RADIOLOGY:  No results found.  EKG:   Orders placed or performed during the hospital encounter of 08/15/15  . EKG 12-Lead  . EKG 12-Lead  . EKG 12-Lead  . EKG 12-Lead       Management plans discussed with the patient,  family and they are in agreement.  CODE STATUS:     Code Status Orders        Start     Ordered   08/18/15 2102  Full code   Continuous     08/18/15 2101    Code Status History    Date Active Date Inactive Code Status Order ID Comments User Context   08/18/2015  6:38 PM 08/18/2015  9:01 PM DNR [id number]  Bettey Costa, MD ED   08/15/2015  8:13 PM 08/17/2015  7:37 PM DNR [id number]  Vaughan Basta, MD Inpatient    Advance Directive Documentation        Most Recent Value   Type of Advance Directive  Healthcare Power of [place], Living will   Pre-existing out of facility DNR order (yellow form or pink MOST form)     "MOST" Form in Place?        TOTAL TIME TAKING CARE OF THIS PATIENT: 40 minutes.    Theodoro Grist M.D on 08/25/2015 at 9:47 AM  Between 7am to 6pm - Pager - [phone]  After 6pm go to [email or web address] - password [email or web address] [facility]  [facility]  Office  [phone]  CC: Primary care physician; No primary care provider on file.

## 2015-08-29 ENCOUNTER — Ambulatory Visit (INDEPENDENT_AMBULATORY_CARE_PROVIDER_SITE_OTHER): Payer: Medicare Other | Admitting: Urology

## 2015-08-29 ENCOUNTER — Encounter: Payer: Self-pay | Admitting: Urology

## 2015-08-29 VITALS — BP 110/64 | HR 76 | Ht 70.0 in | Wt 144.0 lb

## 2015-08-29 DIAGNOSIS — R339 Retention of urine, unspecified: Secondary | ICD-10-CM | POA: Diagnosis not present

## 2015-08-29 LAB — BLADDER SCAN AMB NON-IMAGING: Scan Result: 148

## 2015-08-29 NOTE — Progress Notes (Signed)
08/29/2015 10:03 AM   Jacob Wade [birth year] [id number]  Referring provider: No referring provider defined for this encounter.  Chief Complaint  Patient presents with  . Urinary Retention     voiding trial referred from ER    HPI: Patient is a 82 year old Caucasian male with Alzheimer's who was found to have urinary retention after being brought to the emergency room complaining of fever and dysuria.  He was found to have 400 cc in his bladder and a Foley catheter was placed.  Blood and urine cultures were negative in the hospital.  Renal ultrasound did not note any hydronephrosis.  He had been on Flomax in the past, but due to his dementia he was not consistent in taking the medication.  He is now living at [facility].  He states that she notes her father has nocturia, incontinence, hesitancy and straining to urinate at times.  She has not noted blood in her father's urine. His I PSS score today is 0/0.  He is anxious to have the catheter removed.      IPSS      08/29/15 0900       International Prostate Symptom Score   How often have you had the sensation of not emptying your bladder? Not at All     How often have you had to urinate less than every two hours? Not at All     How often have you found you stopped and started again several times when you urinated? Not at All     How often have you found it difficult to postpone urination? Not at All     How often have you had a weak urinary stream? Not at All     How often have you had to strain to start urination? Not at All     How many times did you typically get up at night to urinate? None     Total IPSS Score 0     Quality of Life due to urinary symptoms   If you were to spend the rest of your life with your urinary condition just the way it is now how would you feel about that? Delighted        Score:  1-7 Mild 8-19 Moderate 20-35 Severe   PMH: Past Medical History  Diagnosis Date  . Dementia   .  Hypertension   . Chronic kidney disease   . Alzheimer's dementia   . Stroke (cerebrum) ([place])   . Skin cancer     Surgical History: Past Surgical History  Procedure Laterality Date  . Mohs surgery    . Head hematoma surgery      from fall    Home Medications:    Medication List       This list is accurate as of: 08/29/15 10:03 AM.  Always use your most recent med list.               amLODipine 10 MG tablet  Commonly known as:  NORVASC  Take 1 tablet (10 mg total) by mouth daily.     amoxicillin-clavulanate 500-125 MG tablet  Commonly known as:  AUGMENTIN  Take 1 tablet (500 mg total) by mouth 2 (two) times daily.     cefUROXime 250 MG tablet  Commonly known as:  CEFTIN     senna-docusate 8.6-50 MG tablet  Commonly known as:  Senokot-S  Take 1 tablet by mouth at bedtime as needed for mild constipation.  sulfamethoxazole-trimethoprim 800-160 MG tablet  Commonly known as:  BACTRIM DS,SEPTRA DS     tamsulosin 0.4 MG Caps capsule  Commonly known as:  FLOMAX  Take 1 capsule (0.4 mg total) by mouth daily.        Allergies:  Allergies  Allergen Reactions  . Cephalosporins Other (See Comments)    Reaction:  Tremors   . Ciprofloxacin Other (See Comments)    Reaction:  Unknown     Family History: Family History  Problem Relation Age of Onset  . CAD Father   . Lung cancer Brother   . Kidney disease Neg Hx   . Prostate cancer Neg Hx     Social History:  reports that he has quit smoking. He does not have any smokeless tobacco history on file. He reports that he drinks about 0.6 oz of alcohol per week. He reports that he does not use illicit drugs.  ROS: UROLOGY Frequent Urination?: No Hard to postpone urination?: No Burning/pain with urination?: No Get up at night to urinate?: Yes Leakage of urine?: Yes Urine stream starts and stops?: No Trouble starting stream?: Yes Do you have to strain to urinate?: Yes Blood in urine?: No Urinary tract  infection?: Yes Sexually transmitted disease?: No Injury to kidneys or bladder?: No Painful intercourse?: No Weak stream?: No Erection problems?: No Penile pain?: No  Gastrointestinal Nausea?: No Vomiting?: No Indigestion/heartburn?: No Diarrhea?: No Constipation?: Yes  Constitutional Fever: Yes Night sweats?: No Weight loss?: Yes Fatigue?: Yes  Skin Skin rash/lesions?: No Itching?: No  Eyes Blurred vision?: No Double vision?: No  Ears/Nose/Throat Sore throat?: No Sinus problems?: No  Hematologic/Lymphatic Swollen glands?: No Easy bruising?: No  Cardiovascular Leg swelling?: No Chest pain?: No  Respiratory Cough?: No Shortness of breath?: No  Endocrine Excessive thirst?: No  Musculoskeletal Back pain?: No Joint pain?: No  Neurological Headaches?: No Dizziness?: No  Psychologic Depression?: No Anxiety?: No  Physical Exam: BP 110/64 mmHg  Pulse 76  Ht 5\' 10"  (1.778 m)  Wt 144 lb (65.318 kg)  BMI 20.66 kg/m2  Constitutional: Well nourished. Alert and oriented, No acute distress. HEENT: [place] AT, moist mucus membranes. Trachea midline, no masses. Cardiovascular: No clubbing, cyanosis, or edema. Respiratory: Normal respiratory effort, no increased work of breathing. GI: Abdomen is soft, non tender, non distended, no abdominal masses. Liver and spleen not palpable.  No hernias appreciated.  Stool sample for occult testing is not indicated.   GU: No CVA tenderness.  No bladder fullness or masses.  Patient with circumcised phallus.   Urethral meatus is patent.  No penile discharge. No penile lesions or rashes. Scrotum without lesions, cysts, rashes and/or edema.  Testicles are located scrotally bilaterally. No masses are appreciated in the testicles. Left and right epididymis are normal. Rectal: Patient with  normal sphincter tone. Anus and perineum without scarring or rashes. No rectal masses are appreciated. Prostate is approximately 50 grams, no  nodules are appreciated. Seminal vesicles are normal. Skin: No rashes, bruises or suspicious lesions. Lymph: No cervical or inguinal adenopathy. Neurologic: Grossly intact, no focal deficits, moving all 4 extremities. Psychiatric: Normal mood and affect.  Laboratory Data: Lab Results  Component Value Date   WBC 11.2* 08/24/2015   HGB 11.2* 08/24/2015   HCT 33.4* 08/24/2015   MCV 89.4 08/24/2015   PLT 231 08/24/2015    Lab Results  Component Value Date   CREATININE 1.10 08/25/2015    Lab Results  Component Value Date   AST 16 08/18/2015  Lab Results  Component Value Date   ALT 11* 08/18/2015   Catheter Removal  Patient is present today for a catheter removal.  90ml of water was drained from the balloon. A 16FR foley cath was removed from the bladder no complications were noted . Patient tolerated well.   Assessment & Plan:  1. BPH with retention:   IPSS score is 0/0.  Patient had Foley catheter removed at today's visit.  He is instructed to increase his fluid intake and to return to the office by 3 PM if he is unable to void. He will also continue the Flomax.  If he is voiding successfully, he will return in 1 month for I PSS score and PVR.    Return in about 1 month (around 09/28/2015) for IPSS and PVR.  These notes generated with voice recognition software. I apologize for typographical errors.  Zara Council, [facility] [address] [place], [place] [address] [phone]

## 2015-08-29 NOTE — Progress Notes (Signed)
Catheter Removal  Patient is present today for a catheter removal.  8.78ml of water was drained from the balloon. A 16FR foley cath was removed from the bladder no complications were noted . Patient tolerated well.  Preformed by: Lyndee Hensen CMA  Follow up/ Additional notes: Come back by 3:00pm if he can not urinate on his own.

## 2015-08-29 NOTE — Progress Notes (Signed)
Patients daughter brought him back to office at 3:30 because she was worried he had not urinated enough from having foley taken out. PVR showed 148 ml. Larene Beach stated that was fine and patient was sent back home.

## 2015-09-07 DIAGNOSIS — M6281 Muscle weakness (generalized): Secondary | ICD-10-CM | POA: Diagnosis not present

## 2015-09-07 DIAGNOSIS — R4189 Other symptoms and signs involving cognitive functions and awareness: Secondary | ICD-10-CM | POA: Diagnosis not present

## 2015-09-07 DIAGNOSIS — R2689 Other abnormalities of gait and mobility: Secondary | ICD-10-CM | POA: Diagnosis not present

## 2015-09-08 DIAGNOSIS — R4189 Other symptoms and signs involving cognitive functions and awareness: Secondary | ICD-10-CM | POA: Diagnosis not present

## 2015-09-08 DIAGNOSIS — M6281 Muscle weakness (generalized): Secondary | ICD-10-CM | POA: Diagnosis not present

## 2015-09-08 DIAGNOSIS — R2689 Other abnormalities of gait and mobility: Secondary | ICD-10-CM | POA: Diagnosis not present

## 2015-09-11 DIAGNOSIS — R2689 Other abnormalities of gait and mobility: Secondary | ICD-10-CM | POA: Diagnosis not present

## 2015-09-11 DIAGNOSIS — M6281 Muscle weakness (generalized): Secondary | ICD-10-CM | POA: Diagnosis not present

## 2015-09-11 DIAGNOSIS — R4189 Other symptoms and signs involving cognitive functions and awareness: Secondary | ICD-10-CM | POA: Diagnosis not present

## 2015-09-12 DIAGNOSIS — R2689 Other abnormalities of gait and mobility: Secondary | ICD-10-CM | POA: Diagnosis not present

## 2015-09-12 DIAGNOSIS — R4189 Other symptoms and signs involving cognitive functions and awareness: Secondary | ICD-10-CM | POA: Diagnosis not present

## 2015-09-12 DIAGNOSIS — M6281 Muscle weakness (generalized): Secondary | ICD-10-CM | POA: Diagnosis not present

## 2015-09-14 DIAGNOSIS — R2689 Other abnormalities of gait and mobility: Secondary | ICD-10-CM | POA: Diagnosis not present

## 2015-09-14 DIAGNOSIS — M6281 Muscle weakness (generalized): Secondary | ICD-10-CM | POA: Diagnosis not present

## 2015-09-14 DIAGNOSIS — R4189 Other symptoms and signs involving cognitive functions and awareness: Secondary | ICD-10-CM | POA: Diagnosis not present

## 2015-09-19 DIAGNOSIS — K5900 Constipation, unspecified: Secondary | ICD-10-CM | POA: Diagnosis not present

## 2015-09-19 DIAGNOSIS — N400 Enlarged prostate without lower urinary tract symptoms: Secondary | ICD-10-CM | POA: Diagnosis not present

## 2015-09-19 DIAGNOSIS — I10 Essential (primary) hypertension: Secondary | ICD-10-CM | POA: Diagnosis not present

## 2015-09-19 DIAGNOSIS — J690 Pneumonitis due to inhalation of food and vomit: Secondary | ICD-10-CM | POA: Diagnosis not present

## 2015-09-19 DIAGNOSIS — N390 Urinary tract infection, site not specified: Secondary | ICD-10-CM | POA: Diagnosis not present

## 2015-09-19 DIAGNOSIS — Z6822 Body mass index (BMI) 22.0-22.9, adult: Secondary | ICD-10-CM | POA: Diagnosis not present

## 2015-12-04 DIAGNOSIS — Z6822 Body mass index (BMI) 22.0-22.9, adult: Secondary | ICD-10-CM | POA: Diagnosis not present

## 2015-12-04 DIAGNOSIS — R600 Localized edema: Secondary | ICD-10-CM | POA: Diagnosis not present

## 2015-12-04 DIAGNOSIS — I10 Essential (primary) hypertension: Secondary | ICD-10-CM | POA: Diagnosis not present

## 2015-12-04 DIAGNOSIS — Z23 Encounter for immunization: Secondary | ICD-10-CM | POA: Diagnosis not present

## 2016-08-01 ENCOUNTER — Encounter: Payer: Self-pay | Admitting: Internal Medicine

## 2016-08-06 ENCOUNTER — Telehealth: Payer: Self-pay

## 2016-08-06 NOTE — Telephone Encounter (Signed)
Med rec completed

## 2016-08-08 ENCOUNTER — Encounter: Payer: Self-pay | Admitting: Internal Medicine

## 2016-08-08 ENCOUNTER — Non-Acute Institutional Stay: Payer: Medicare Other | Admitting: Internal Medicine

## 2016-08-08 VITALS — BP 128/66 | HR 76 | Temp 97.1°F | Resp 20 | Wt 165.0 lb

## 2016-08-08 DIAGNOSIS — F0280 Dementia in other diseases classified elsewhere without behavioral disturbance: Secondary | ICD-10-CM

## 2016-08-08 DIAGNOSIS — I10 Essential (primary) hypertension: Secondary | ICD-10-CM

## 2016-08-08 DIAGNOSIS — J301 Allergic rhinitis due to pollen: Secondary | ICD-10-CM

## 2016-08-08 DIAGNOSIS — N401 Enlarged prostate with lower urinary tract symptoms: Secondary | ICD-10-CM

## 2016-08-08 DIAGNOSIS — G301 Alzheimer's disease with late onset: Secondary | ICD-10-CM | POA: Diagnosis not present

## 2016-08-08 DIAGNOSIS — G309 Alzheimer's disease, unspecified: Secondary | ICD-10-CM

## 2016-08-08 NOTE — Assessment & Plan Note (Addendum)
Mild  Independent with ADLs but appropriately now in [place] No Rx  Extended discussion with daughter Lelon Frohlich via phone

## 2016-08-08 NOTE — Progress Notes (Signed)
Subjective:    Patient ID: LA DIBELLA, male    DOB: 12/13/1923, 83 y.o.   MRN: [id number]  HPI  Visit to apartment in assisted living at [facility] to establish care Reviewed status with RN here  [facility] a number of years [place]oved to [place] several [place] ago---needing increased care (daughter's idea) Doing okay living here  HTN diagnosed at some point He is not sure how long No heart trouble  No history of stroke  Has BPH On medication for this--but he doesn't remember Voids okay Nocturia x 1 No dribbling No incontinence  Has dementia Started some years ago [employer] twice a week Diagnosed with Alzheimers  Current Outpatient Prescriptions on File Prior to Visit  Medication Sig Dispense Refill  . amLODipine (NORVASC) 10 MG tablet Take 1 tablet (10 mg total) by mouth daily. 30 tablet 0  . tamsulosin (FLOMAX) 0.4 MG CAPS capsule Take 1 capsule (0.4 mg total) by mouth daily. 30 capsule 0   No current facility-administered medications on file prior to visit.     Allergies  Allergen Reactions  . Cephalosporins Other (See Comments)    Reaction:  Tremors   . Ciprofloxacin Other (See Comments)    Reaction:  Unknown     Past Medical History:  Diagnosis Date  . Alzheimer's dementia   . Chronic kidney disease   . Hypertension   . Skin cancer   . [facility]rebrum[facility])     Past Surgical History:  Procedure Laterality Date  . head hematoma surgery     from fall  . MOHS SURGERY      Family History  Problem Relation Age of Onset  . CAD Father   . Lung cancer Brother   . Kidney disease Neg Hx   . Prostate cancer Neg Hx     Social History   Social History  . Marital status: Married    Spouse name: N/A  . Number of children: 22  . Years of education: N/A   Occupational History  . [place] History Main Topics  . Smoking status: Former Research scientist (life sciences)  . Smokeless tobacco: Never Used     Comment: quit 1951  .  Alcohol use 0.6 oz/week    1 Shots of liquor per week     Comment: Bourbon before dinner  . Drug use: No  . Sexual activity: Not on file   Other Topics Concern  . Not on file   Social History Narrative   1 son, 1 daughter      Has living will    Daughter has health care POA   Has DNR --confirmed and written 08/08/16   No tube feeds   Review of Systems  Constitutional: Negative for fatigue and unexpected weight change.  HENT: Positive for hearing loss and tinnitus. Negative for trouble swallowing.        Teeth are okay Hearing aides  Eyes: Negative for visual disturbance.       No diplopia or unilateral vision loss  Respiratory: Negative for cough, chest tightness and shortness of breath.   Cardiovascular: Negative for chest pain, palpitations and leg swelling.  Gastrointestinal: Negative for blood in stool, constipation and nausea.       No heartburn  Endocrine: Negative for polydipsia and polyuria.  Genitourinary: Negative for difficulty urinating and urgency.  Musculoskeletal: Negative for arthralgias, back pain and joint swelling.  Skin: Negative for rash.  Allergic/Immunologic:  Positive for environmental allergies.  Neurological: Negative for dizziness, syncope, light-headedness and headaches.  Hematological: Negative for adenopathy. Does not bruise/bleed easily.  Psychiatric/Behavioral: Negative for dysphoric mood and sleep disturbance. The patient is not nervous/anxious.        Objective:   Physical Exam  Constitutional: He appears well-nourished. No distress.  HENT:  Mouth/Throat: Oropharynx is clear and moist.  HOH  Neck: Normal range of motion. No thyromegaly present.  Cardiovascular: Normal rate, regular rhythm and normal heart sounds.  Exam reveals no gallop.   No murmur heard. Faint pedal pulses  Pulmonary/Chest: Effort normal and breath sounds normal. No respiratory distress. He has no wheezes. He has no rales.  Abdominal: Soft. There is no tenderness.    Musculoskeletal: He exhibits no tenderness.  Trace ankle edema  Lymphadenopathy:    He has no cervical adenopathy.  Neurological:  Normal interaction--- clear memory issues 5/16 [facility] [employer]--- ?  [id number]  Skin: No erythema.  Psychiatric: He has a normal mood and affect. His behavior is normal.          Assessment & Plan:

## 2016-08-08 NOTE — Assessment & Plan Note (Signed)
Apparently had AKI and some obstruction in the past Seems to be voiding okay on the tamsulosin

## 2016-08-08 NOTE — Assessment & Plan Note (Signed)
BP Readings from Last 3 Encounters:  08/08/16 128/66  08/29/15 110/64  08/25/15 129/63   Good control on low dose amlodipine

## 2016-08-08 NOTE — Assessment & Plan Note (Signed)
Will use loratadine prn

## 2016-10-03 IMAGING — CT CT HEAD W/O CM
2 series · 14 of 30 positions shown, 16 images · non-contrast
Comparison: None.

CLINICAL DATA: Status post fall this morning.  Initial encounter.

EXAM:
CT HEAD WITHOUT CONTRAST
TECHNIQUE: Contiguous axial images were obtained from the base of the skull
through the vertex without intravenous contrast.

[Series 2: head wo · axial · 0.43mm/px · z∈[+226,+341]mm · 6 of 33 slices shown, 8 images]
[im 5/33  brain]
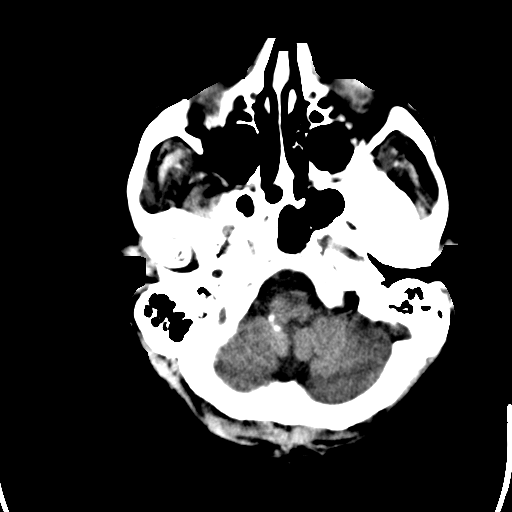
[im 5/33  bone]
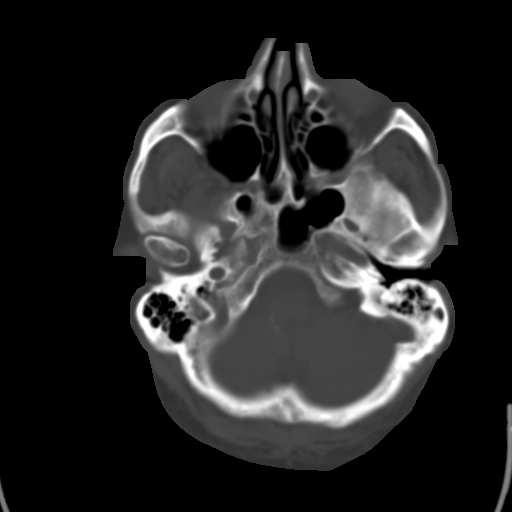
[im 10/33  brain]
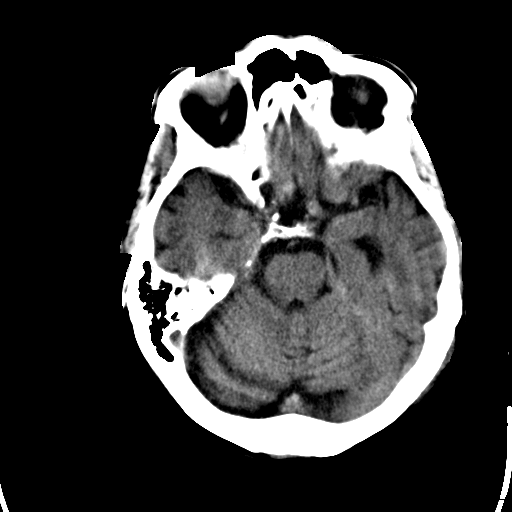
[im 14/33  brain]
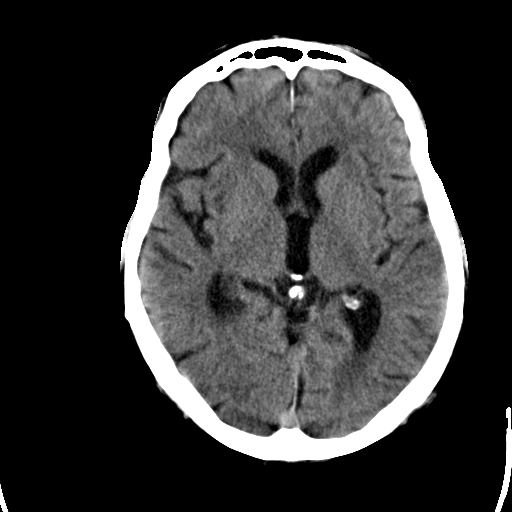
[im 19/33  brain]
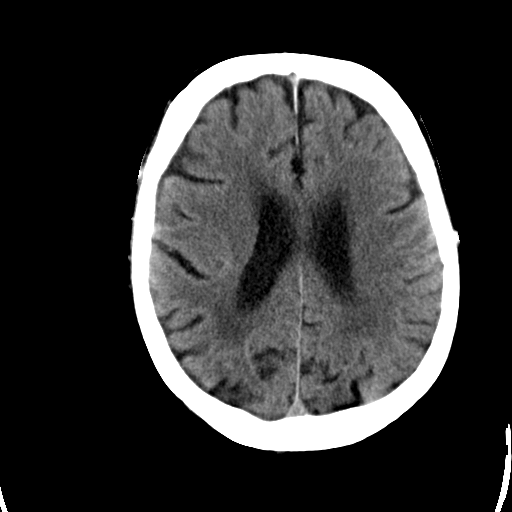
[im 23/33  brain]
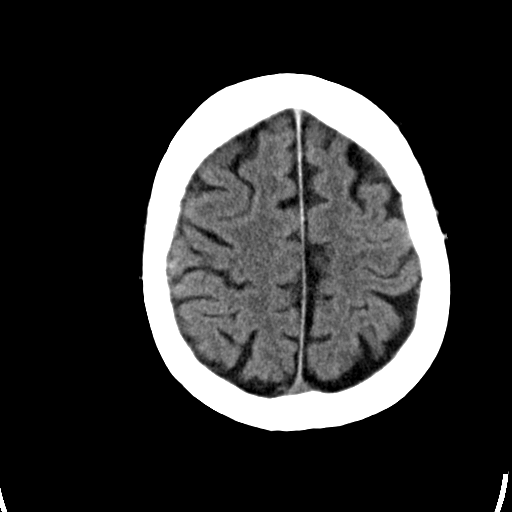
[im 23/33  bone]
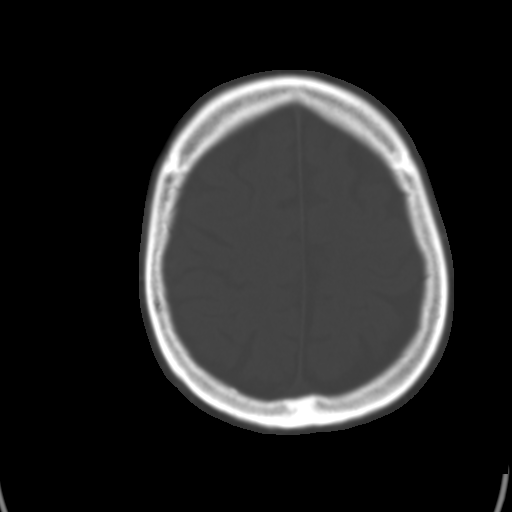
[im 28/33  brain]
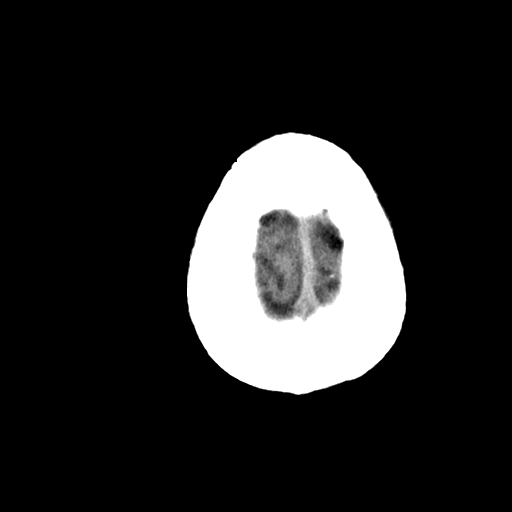

[Series 3: head bone · axial · 0.43mm/px · z∈[+212,+360]mm · 8 of 92 slices shown]
[im 9/92  bone]
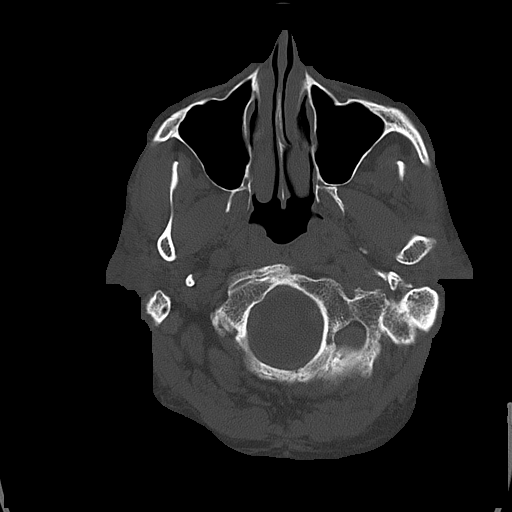
[im 18/92  bone]
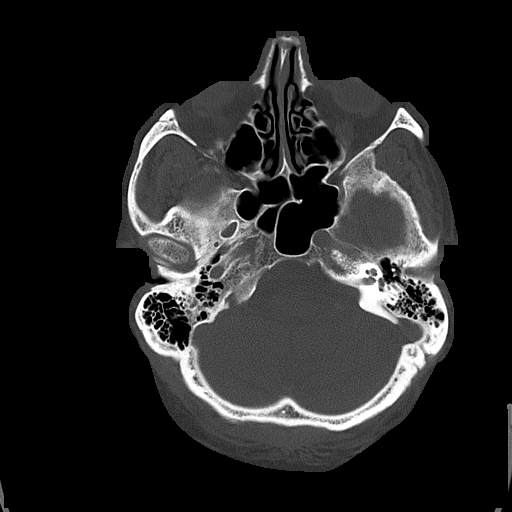
[im 31/92  bone]
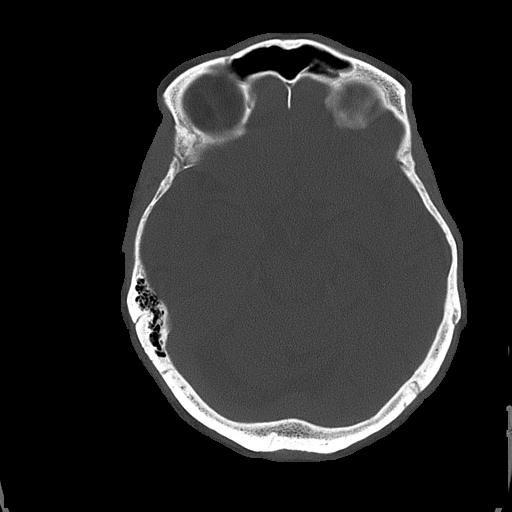
[im 40/92  bone]
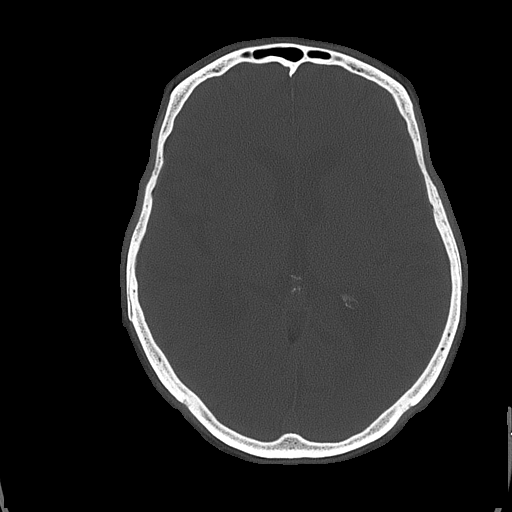
[im 53/92  bone]
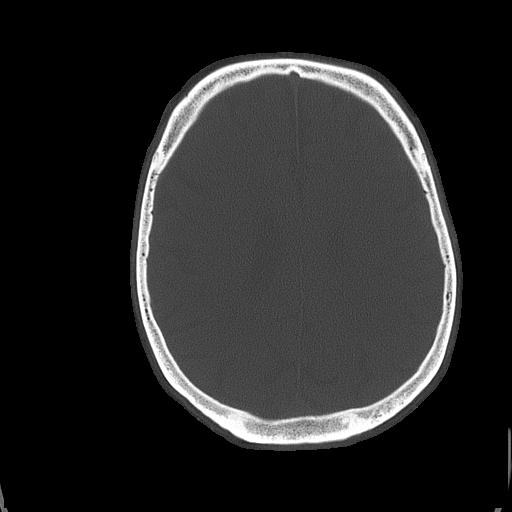
[im 61/92  bone]
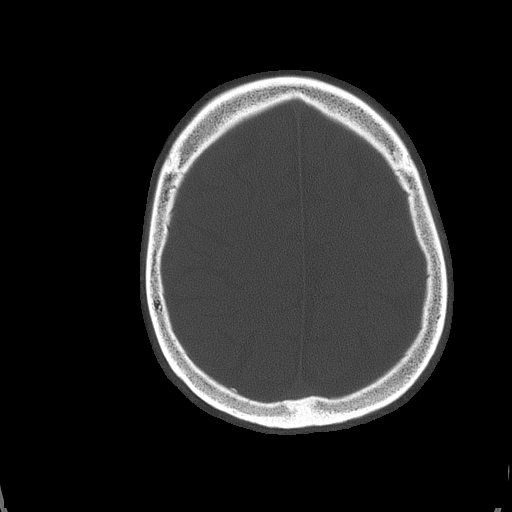
[im 74/92  bone]
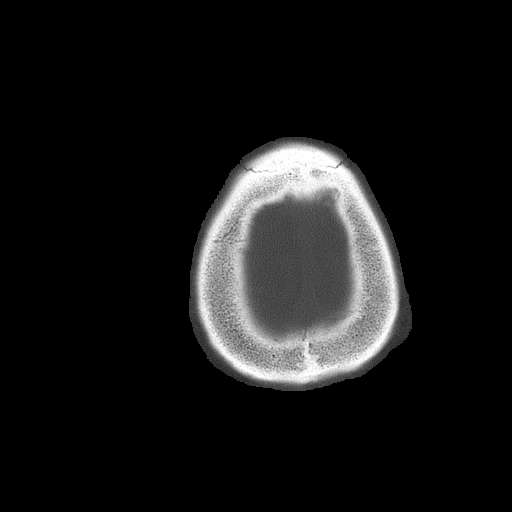
[im 83/92  bone]
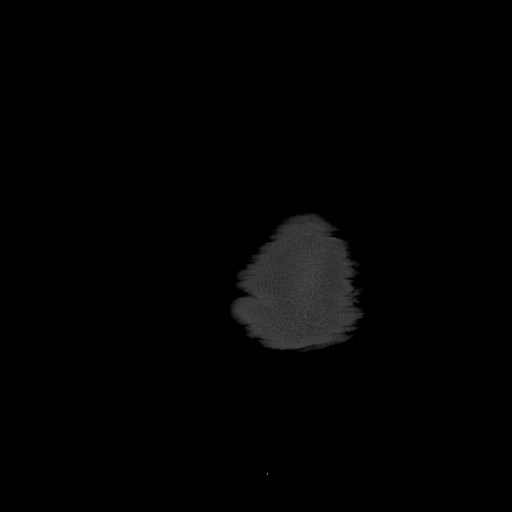

[14 of 30 positions shown; findings below may reference images not displayed]

FINDINGS: There is some cortical atrophy and chronic microvascular ischemic
change. No evidence of acute intracranial abnormality including
hemorrhage, infarct, mass lesion, mass effect, midline shift or
abnormal extra-axial fluid collection. No hydrocephalus or
pneumocephalus. The calvarium is intact.
IMPRESSION: No acute abnormality.

Atrophy and chronic microvascular ischemic change.

## 2016-11-06 ENCOUNTER — Encounter: Payer: Self-pay | Admitting: Internal Medicine

## 2016-11-06 ENCOUNTER — Non-Acute Institutional Stay: Payer: Medicare Other | Admitting: Internal Medicine

## 2016-11-06 VITALS — BP 120/60 | HR 80 | Resp 18 | Wt 175.0 lb

## 2016-11-06 DIAGNOSIS — L989 Disorder of the skin and subcutaneous tissue, unspecified: Secondary | ICD-10-CM

## 2016-11-06 NOTE — Progress Notes (Signed)
Subjective:    Patient ID: Jacob Wade, male    DOB: 17-Nov-1923, 83 y.o.   MRN: [id number]  HPI  Asked to see resident in ALF 2 skins lesion on left hand He is not sure how long they have been there He tried to "burn them off with a match" per RN They have not drained He has not tried anything OTC for this.  Review of Systems      Past Medical History:  Diagnosis Date  . Alzheimer's dementia   . Chronic kidney disease   . Hypertension   . Skin cancer   . Stroke (cerebrum) [facility])     Current Outpatient Prescriptions  Medication Sig Dispense Refill  . amLODipine (NORVASC) 10 MG tablet Take 1 tablet (10 mg total) by mouth daily. 30 tablet 0  . tamsulosin (FLOMAX) 0.4 MG CAPS capsule Take 1 capsule (0.4 mg total) by mouth daily. 30 capsule 0   No current facility-administered medications for this visit.     Allergies  Allergen Reactions  . Cephalosporins Other (See Comments)    Reaction:  Tremors   . Ciprofloxacin Other (See Comments)    Reaction:  Unknown     Family History  Problem Relation Age of Onset  . CAD Father   . Lung cancer Brother   . Kidney disease Neg Hx   . Prostate cancer Neg Hx     Social History   Social History  . Marital status: Married    Spouse name: N/A  . Number of children: 22  . Years of education: N/A   Occupational History  . [place] History Main Topics  . Smoking status: Former Research scientist (life sciences)  . Smokeless tobacco: Never Used     Comment: quit 1951  . Alcohol use 0.6 oz/week    1 Shots of liquor per week     Comment: Bourbon before dinner  . Drug use: No  . Sexual activity: Not on file   Other Topics Concern  . Not on file   Social History Narrative   1 son, 1 daughter      Has living will    Daughter has health care POA   Has DNR --confirmed and written 08/08/16   No tube feeds   [place]em[place] Society     Constitutional: Denies fever, malaise, fatigue, headache or abrupt weight  changes.  Skin: Pt reports skin lesions of left hand. Denies redness, rashes or ulcercations.    No other specific complaints in a complete review of systems (except as listed in HPI above).  Objective:   Physical Exam    BP 120/60   Pulse 80   Resp 18   Wt 175 lb (79.4 kg)   BMI 25.11 kg/m  Wt Readings from Last 3 Encounters:  11/06/16 175 lb (79.4 kg)  08/08/16 165 lb (74.8 kg)  08/29/15 144 lb (65.3 kg)    General: Appears his stated age, in NAD. Skin: 2 distinct round, raised, scaly lesions noted on posterior surface of left hand.  BMET    Component Value Date/Time   NA 136 08/24/2015 0617   K 4.5 08/22/2015 0326   CL 104 08/22/2015 0326   CO2 25 08/22/2015 0326   GLUCOSE 98 08/22/2015 0326   BUN 20 08/22/2015 0326   CREATININE 1.10 08/25/2015 0450   CALCIUM 7.7 (L) 08/22/2015 0326   GFRNONAA 57 (L) 08/25/2015 0450   GFRAA >60 08/25/2015 0450  Lipid Panel  No results found for: CHOL, TRIG, HDL, CHOLHDL, VLDL, LDLCALC  CBC    Component Value Date/Time   WBC 11.2 (H) 08/24/2015 0617   RBC 3.74 (L) 08/24/2015 0617   HGB 11.2 (L) 08/24/2015 0617   HCT 33.4 (L) 08/24/2015 0617   PLT 231 08/24/2015 0617   MCV 89.4 08/24/2015 0617   MCH 29.9 08/24/2015 0617   MCHC 33.4 08/24/2015 0617   RDW 14.2 08/24/2015 0617   LYMPHSABS 1.1 08/23/2015 0521   MONOABS 0.9 08/23/2015 0521   EOSABS 0.5 08/23/2015 0521   BASOSABS 0.0 08/23/2015 0521    Hgb A1C No results found for: HGBA1C        Assessment & Plan:   Skin Lesion of Hand:  Will see if Dr. Silvio Pate can treat with cryotherapy If not, will get derm referral  Will reassess as needed Webb Silversmith, NP

## 2016-11-06 NOTE — Patient Instructions (Signed)
Excision of Skin Lesions, Care After Refer to this sheet in the next few weeks. These instructions provide you with information about caring for yourself after your procedure. Your health care provider may also give you more specific instructions. Your treatment has been planned according to current medical practices, but problems sometimes occur. Call your health care provider if you have any problems or questions after your procedure. What can I expect after the procedure? After your procedure, it is common to have pain or discomfort at the excision site. Follow these instructions at home:  Take over-the-counter and prescription medicines only as told by your health care provider.  Follow instructions from your health care provider about: ? How to take care of your excision site. You should keep the site clean, dry, and protected for at least 48 hours. ? When and how you should change your bandage (dressing). ? When you should remove your dressing. ? Removing whatever was used to close your excision site.  Check the excision area every day for signs of infection. Watch for: ? Redness, swelling, or pain. ? Fluid, blood, or pus.  For bleeding, apply gentle but firm pressure to the area using a folded towel for 20 minutes.  Avoid high-impact exercise and activities until the stitches (sutures) are removed or the area heals.  Follow instructions from your health care provider about how to minimize scarring. Avoid sun exposure until the area has healed. Scarring should lessen over time.  Keep all follow-up visits as told by your health care provider. This is important. Contact a health care provider if:  You have a fever.  You have redness, swelling, or pain at the excision site.  You have fluid, blood, or pus coming from the excision site.  You have ongoing bleeding at the excision site.  You have pain that does not improve in 2-3 days after your procedure.  You notice skin  irregularities or changes in sensation. This information is not intended to replace advice given to you by your health care provider. Make sure you discuss any questions you have with your health care provider. Document Released: 07/26/2014 Document Revised: 08/17/2015 Document Reviewed: 04/27/2014 Elsevier Interactive Patient Education  2018 Elsevier Inc.  

## 2016-11-20 ENCOUNTER — Non-Acute Institutional Stay: Payer: Medicare Other | Admitting: Internal Medicine

## 2016-11-20 ENCOUNTER — Encounter: Payer: Self-pay | Admitting: Internal Medicine

## 2016-11-20 VITALS — BP 128/72 | HR 70 | Wt 173.0 lb

## 2016-11-20 DIAGNOSIS — F0281 Dementia in other diseases classified elsewhere with behavioral disturbance: Secondary | ICD-10-CM

## 2016-11-20 DIAGNOSIS — N400 Enlarged prostate without lower urinary tract symptoms: Secondary | ICD-10-CM | POA: Diagnosis not present

## 2016-11-20 DIAGNOSIS — I10 Essential (primary) hypertension: Secondary | ICD-10-CM

## 2016-11-20 DIAGNOSIS — G309 Alzheimer's disease, unspecified: Secondary | ICD-10-CM | POA: Diagnosis not present

## 2016-11-20 DIAGNOSIS — L989 Disorder of the skin and subcutaneous tissue, unspecified: Secondary | ICD-10-CM

## 2016-11-20 NOTE — Progress Notes (Signed)
Subjective:    Patient ID: Jacob Wade, male    DOB: 1923/08/09, 83 y.o.   MRN: [id number]  HPI  Saw pt in [address] for routine visit. No new concerns from RN or resident Wife concerned about increase in sexual behaviors, towards her. He sleeps well, independent with ADL's. He goes to the [employer] 3 days per week. He walks without assistance. His appetite is good, weight has been stable. His bowels are moving normally. He denies urinary concerns. He denies reflux, pain or SOB. He has a lesion on his left hand that he has set an appt up with dermatology for He reports skin lesion on left upper back. He is not sure how long it has been there or if it has changed at all. He has not put anything topically on it.   Alzheimer's: Mild cognitive issues. He is currently not taking any medications at this time.  BPH: He is voiding fine on Flomax. He denies urinary incontinence.  HTN: BP controlled on Amlodipine.   Review of Systems      Past Medical History:  Diagnosis Date  . Alzheimer's dementia   . Chronic kidney disease   . Hypertension   . Skin cancer   . Stroke (cerebrum) [facility])     Current Outpatient Prescriptions  Medication Sig Dispense Refill  . amLODipine (NORVASC) 10 MG tablet Take 1 tablet (10 mg total) by mouth daily. 30 tablet 0  . tamsulosin (FLOMAX) 0.4 MG CAPS capsule Take 1 capsule (0.4 mg total) by mouth daily. 30 capsule 0   No current facility-administered medications for this visit.     Allergies  Allergen Reactions  . Cephalosporins Other (See Comments)    Reaction:  Tremors   . Ciprofloxacin Other (See Comments)    Reaction:  Unknown     Family History  Problem Relation Age of Onset  . CAD Father   . Lung cancer Brother   . Kidney disease Neg Hx   . Prostate cancer Neg Hx     Social History   Social History  . Marital status: Married    Spouse name: N/A  . Number of children: 22  . Years of education: N/A   Occupational  History  . [place] History Main Topics  . Smoking status: Former Research scientist (life sciences)  . Smokeless tobacco: Never Used     Comment: quit 1951  . Alcohol use 0.6 oz/week    1 Shots of liquor per week     Comment: Bourbon before dinner  . Drug use: No  . Sexual activity: Not on file   Other Topics Concern  . Not on file   Social History Narrative   1 son, 1 daughter      Has living will    Daughter has health care POA   Has DNR --confirmed and written 08/08/16   No tube feeds   Hemlock Society     Constitutional: Denies fever, malaise, fatigue, headache or abrupt weight changes.  HEENT: Denies eye pain, eye redness, ear pain, ringing in the ears, wax buildup, runny nose, nasal congestion, bloody nose, or sore throat. Respiratory: Denies difficulty breathing, shortness of breath, cough or sputum production.   Cardiovascular: Denies chest pain, chest tightness, palpitations or swelling in the hands or feet.  Gastrointestinal: Denies abdominal pain, bloating, constipation, diarrhea or blood in the stool.  GU: Denies urgency, frequency, pain with urination, burning sensation, blood in  urine, odor or discharge. Musculoskeletal: Denies decrease in range of motion, difficulty with gait, muscle pain or joint pain and swelling.  Skin: Pt reports skin lesion of left hand, skin lesion of back. Denies ulcercations.  Neurological: Pt reports mild difficulty with memory. Denies dizziness, difficulty with speech or problems with balance and coordination.  Psych: Denies anxiety, depression, SI/HI.  No other specific complaints in a complete review of systems (except as listed in HPI above).  Objective:   Physical Exam   BP 128/72   Pulse 70   Wt 173 lb (78.5 kg)   BMI 24.82 kg/m  Wt Readings from Last 3 Encounters:  11/20/16 173 lb (78.5 kg)  11/06/16 175 lb (79.4 kg)  08/08/16 165 lb (74.8 kg)    General: Appears his stated age, in NAD. Skin: 0.5 cm  round, raised, scaly lesion noted on dorsal surface of left hand. Round, raised, 1 cm lesion noted on left upper back.  Cardiovascular: Normal rate and rhythm. No murmur, rubs or gallops noted. No JVD or BLE edema.  Pulmonary/Chest: Normal effort and positive vesicular breath sounds. No respiratory distress. No wheezes, rales or ronchi noted.  Abdomen: Soft and nontender. Normal bowel sounds. No distention or masses noted.  Musculoskeletal:  No difficulty with gait.  Neurological: Alert and oriented.  Psychiatric: Mood and affect normal. Behavior is normal. Judgment and thought content normal.    BMET    Component Value Date/Time   NA 136 08/24/2015 0617   K 4.5 08/22/2015 0326   CL 104 08/22/2015 0326   CO2 25 08/22/2015 0326   GLUCOSE 98 08/22/2015 0326   BUN 20 08/22/2015 0326   CREATININE 1.10 08/25/2015 0450   CALCIUM 7.7 (L) 08/22/2015 0326   GFRNONAA 57 (L) 08/25/2015 0450   GFRAA >60 08/25/2015 0450    Lipid Panel  No results found for: CHOL, TRIG, HDL, CHOLHDL, VLDL, LDLCALC  CBC    Component Value Date/Time   WBC 11.2 (H) 08/24/2015 0617   RBC 3.74 (L) 08/24/2015 0617   HGB 11.2 (L) 08/24/2015 0617   HCT 33.4 (L) 08/24/2015 0617   PLT 231 08/24/2015 0617   MCV 89.4 08/24/2015 0617   MCH 29.9 08/24/2015 0617   MCHC 33.4 08/24/2015 0617   RDW 14.2 08/24/2015 0617   LYMPHSABS 1.1 08/23/2015 0521   MONOABS 0.9 08/23/2015 0521   EOSABS 0.5 08/23/2015 0521   BASOSABS 0.0 08/23/2015 0521    Hgb A1C No results found for: HGBA1C         Assessment & Plan:   BPH:  Continue Flomax  Alzheimer's:  Mild cognitive issues  He does not think he needs any medication for his behaviors that his wife described Will monitor for now  HTN:  Controlled on Amlodipine Will monitor  Skin Lesion of Hand, Skin Lesion of Back:  He will follow up with dermatology for the same  Will reassess as needed Webb Silversmith, NP

## 2016-12-12 DIAGNOSIS — D485 Neoplasm of uncertain behavior of skin: Secondary | ICD-10-CM | POA: Diagnosis not present

## 2016-12-12 DIAGNOSIS — C44629 Squamous cell carcinoma of skin of left upper limb, including shoulder: Secondary | ICD-10-CM | POA: Diagnosis not present

## 2017-01-23 IMAGING — DX DG CHEST 1V
1 series · 1 of 1 positions shown · non-contrast
Comparison: 04/28/2015.

CLINICAL DATA: Sepsis.

EXAM:
CHEST 1 VIEW

[chest ap]
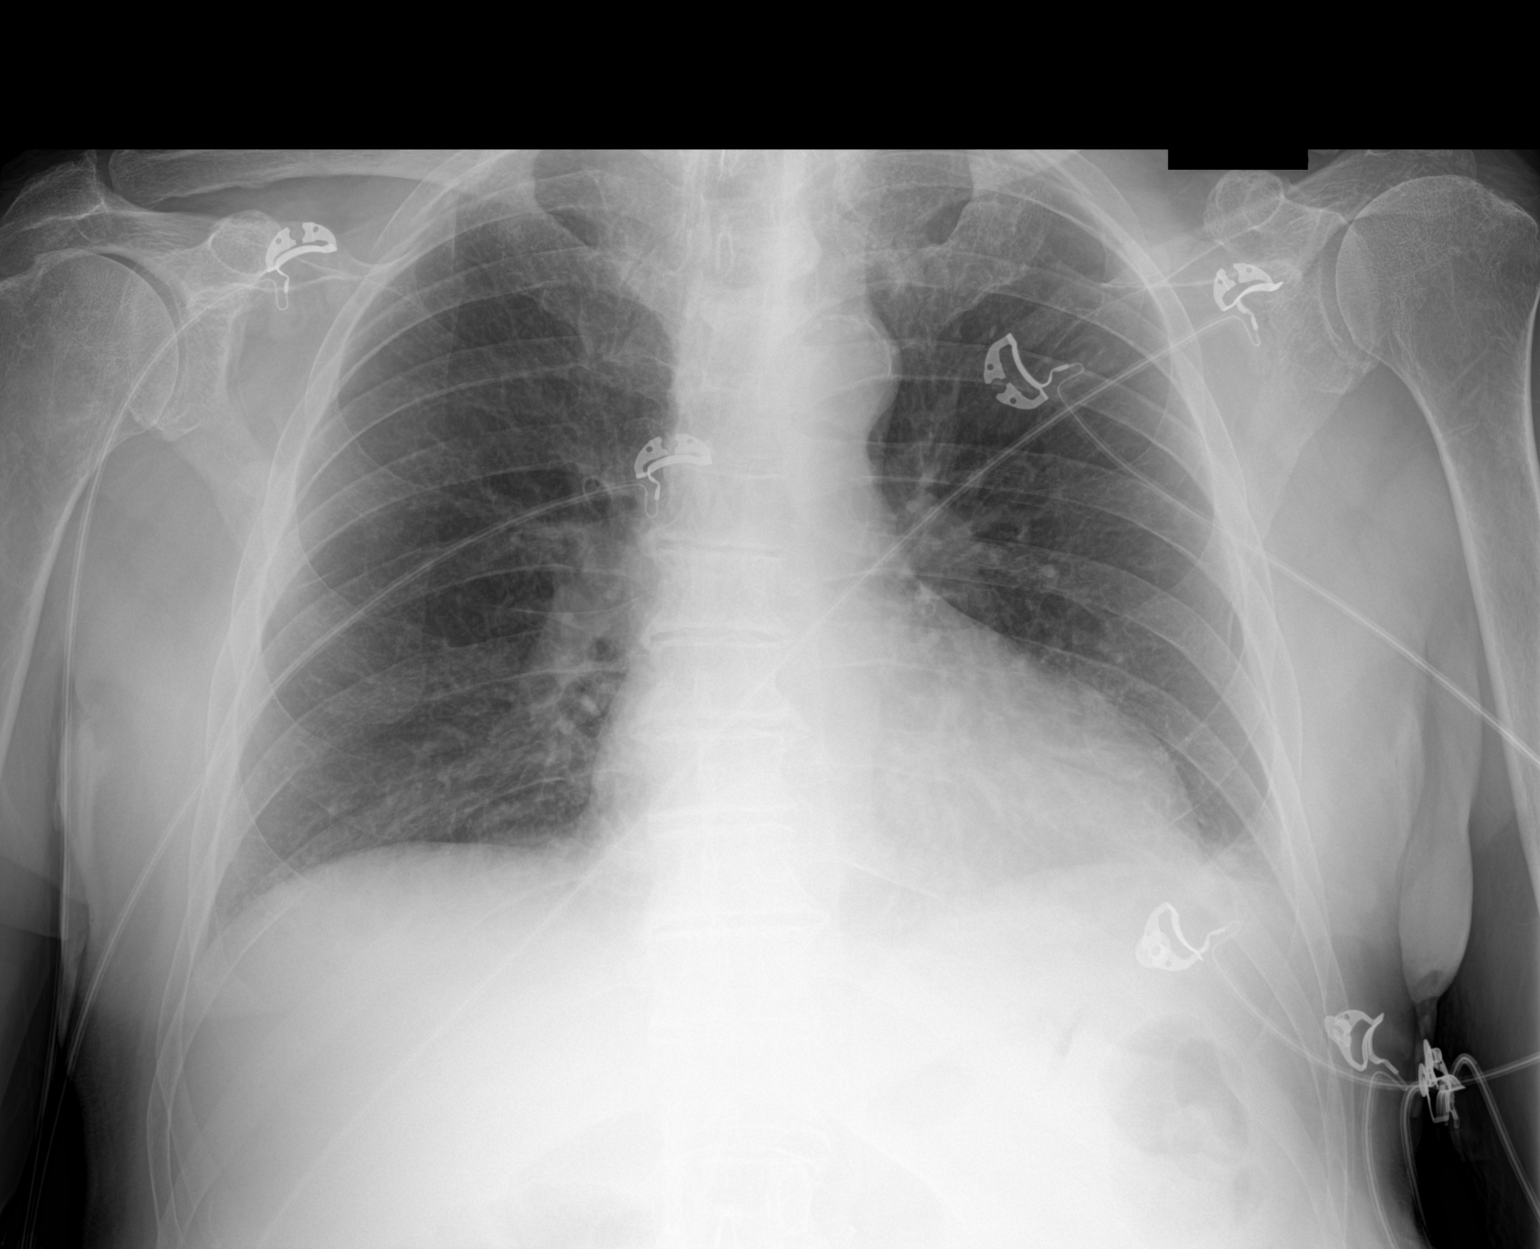

[1 of 1 positions shown; findings below may reference images not displayed]

FINDINGS: The cardiac silhouette remains borderline enlarged. Minimal patchy
opacity at the left lung base. Otherwise, clear lungs. Diffuse
osteopenia.
IMPRESSION: Minimal patchy atelectasis or pneumonia at the left lung base.

## 2017-01-27 IMAGING — DX DG CHEST 1V PORT
1 series · 1 of 1 positions shown · non-contrast
Comparison: Chest radiograph performed 08/18/2015

CLINICAL DATA: Acute onset of fever.  Initial encounter.

EXAM:
PORTABLE CHEST 1 VIEW

[chest ap]
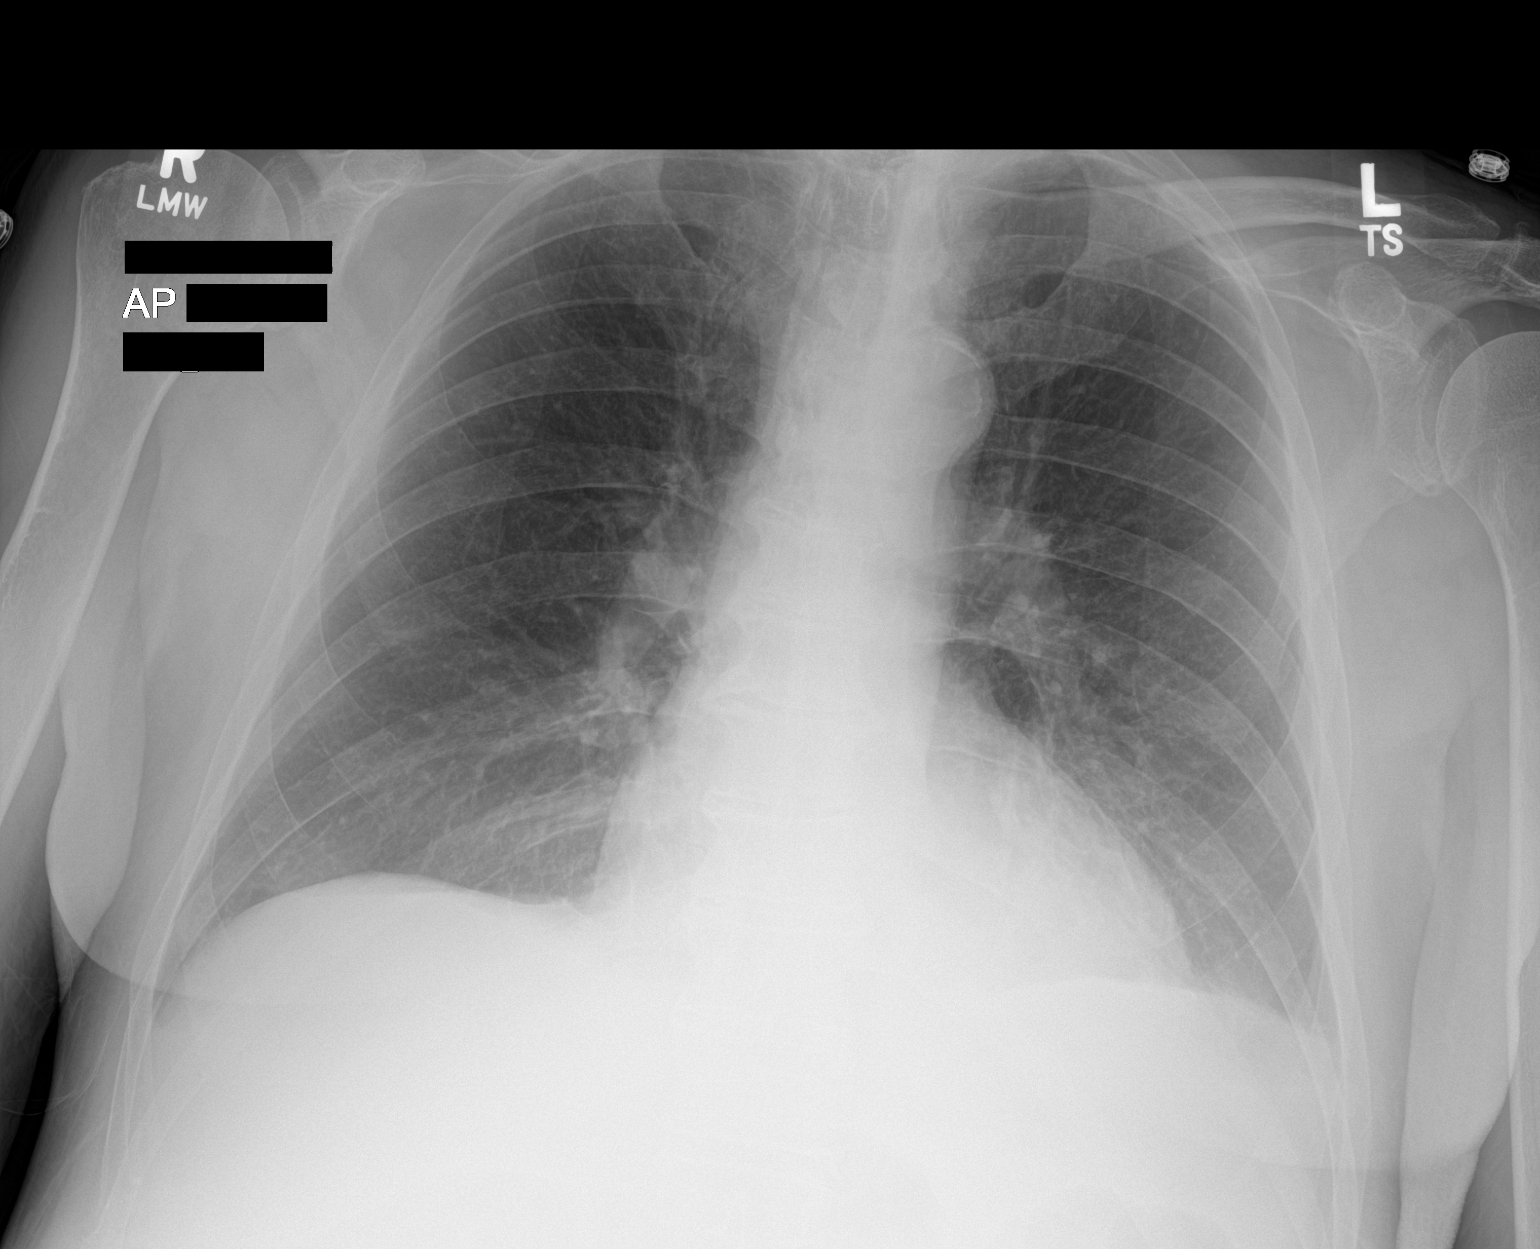

[1 of 1 positions shown; findings below may reference images not displayed]

FINDINGS: The lungs are well-aerated. Minimal left basilar opacity may reflect
atelectasis or mild pneumonia. There is no evidence of pleural
effusion or pneumothorax.

The cardiomediastinal silhouette is borderline normal in size. No
acute osseous abnormalities are seen.
IMPRESSION: Minimal left basilar opacity may reflect atelectasis or mild
pneumonia.

## 2017-01-30 ENCOUNTER — Encounter: Payer: Self-pay | Admitting: Internal Medicine

## 2017-01-30 DIAGNOSIS — I10 Essential (primary) hypertension: Secondary | ICD-10-CM | POA: Diagnosis not present

## 2017-02-20 ENCOUNTER — Encounter: Payer: Self-pay | Admitting: Internal Medicine

## 2017-02-20 ENCOUNTER — Ambulatory Visit: Payer: Medicare Other | Admitting: Internal Medicine

## 2017-02-20 VITALS — BP 134/74 | HR 77 | Temp 96.9°F | Resp 14 | Wt 187.0 lb

## 2017-02-20 DIAGNOSIS — N183 Chronic kidney disease, stage 3 unspecified: Secondary | ICD-10-CM

## 2017-02-20 DIAGNOSIS — G301 Alzheimer's disease with late onset: Secondary | ICD-10-CM | POA: Diagnosis not present

## 2017-02-20 DIAGNOSIS — N401 Enlarged prostate with lower urinary tract symptoms: Secondary | ICD-10-CM | POA: Diagnosis not present

## 2017-02-20 DIAGNOSIS — N1832 Chronic kidney disease, stage 3b: Secondary | ICD-10-CM | POA: Insufficient documentation

## 2017-02-20 DIAGNOSIS — F0280 Dementia in other diseases classified elsewhere without behavioral disturbance: Secondary | ICD-10-CM

## 2017-02-20 DIAGNOSIS — I10 Essential (primary) hypertension: Secondary | ICD-10-CM | POA: Diagnosis not present

## 2017-02-20 DIAGNOSIS — R609 Edema, unspecified: Secondary | ICD-10-CM | POA: Diagnosis not present

## 2017-02-20 MED ORDER — AMLODIPINE BESYLATE 5 MG PO TABS: 5.0000 mg | ORAL_TABLET | Freq: Every day | ORAL | 11 refills | Status: AC

## 2017-02-20 NOTE — Assessment & Plan Note (Signed)
Mild Still doing well with assistance of AL staff and goes to day program

## 2017-02-20 NOTE — Assessment & Plan Note (Signed)
If he needs another medication, will start ARB

## 2017-02-20 NOTE — Assessment & Plan Note (Signed)
Voids fine on tamsulosin

## 2017-02-20 NOTE — Assessment & Plan Note (Signed)
BP Readings from Last 3 Encounters:  02/20/17 134/74  11/20/16 128/72  11/06/16 120/60   Will cut the amlodipine due to edema If worsens, would start low dose losartan

## 2017-02-20 NOTE — Progress Notes (Signed)
Subjective:    Patient ID: Jacob Wade, male    DOB: 1924-01-03, 83 y.o.   MRN: [id number]  HPI Visit in his room for follow up of chronic medical conditions Reviewed status with Luellen Pucker RN Wife and daughter here  Having some foot swelling He doesn't notice No pain and he hasn't noticed No SOB Sleeps flat and no PND No chest pain or palpitations  Voids okay No nocturia Denies daytime problems  Ongoing mild confusion Still goes to [facility] [employer] daily Generally continent Can do his ADLs  Current Outpatient Medications on File Prior to Visit  Medication Sig Dispense Refill  . amLODipine (NORVASC) 10 MG tablet Take 1 tablet (10 mg total) by mouth daily. 30 tablet 0  . tamsulosin (FLOMAX) 0.4 MG CAPS capsule Take 1 capsule (0.4 mg total) by mouth daily. 30 capsule 0   No current facility-administered medications on file prior to visit.     Allergies  Allergen Reactions  . Cephalosporins Other (See Comments)    Reaction:  Tremors   . Ciprofloxacin Other (See Comments)    Reaction:  Unknown     Past Medical History:  Diagnosis Date  . Alzheimer's dementia   . Chronic kidney disease   . Chronic kidney disease, stage III (moderate) (HCC)   . Hypertension   . Skin cancer   . Stroke (cerebrum) [facility])     Past Surgical History:  Procedure Laterality Date  . head hematoma surgery     from fall  . MOHS SURGERY      Family History  Problem Relation Age of Onset  . CAD Father   . Lung cancer Brother   . Kidney disease Neg Hx   . Prostate cancer Neg Hx     Social History   Socioeconomic History  . Marital status: Married    Spouse name: Not on file  . Number of children: 22  . Years of education: Not on file  . Highest education level: Not on file  Social Needs  . Financial resource strain: Not on file  . Food insecurity - worry: Not on file  . Food insecurity - inability: Not on file  . Transportation needs - medical: Not on file  .  Transportation needs - non-medical: Not on file  Occupational History  . Occupation: Academic librarian    Comment: [employer]  Tobacco Use  . Smoking status: Former Research scientist (life sciences)  . Smokeless tobacco: Never Used  . Tobacco comment: quit 1951  Substance and Sexual Activity  . Alcohol use: Yes    Alcohol/week: 0.6 oz    Types: 1 Shots of liquor per week    Comment: Bourbon before dinner  . Drug use: No  . Sexual activity: Not on file  Other Topics Concern  . Not on file  Social History Narrative   1 son, 1 daughter      Has living will    Daughter has health care POA   Has DNR --confirmed and written 08/08/16   No tube feeds   [place] Society    Review of Systems Sleeps well Eats well--carbs, desserts, etc. Has gained a bit of weight Bowels are fine No heartburn No arthritis pain    Objective:   Physical Exam  Constitutional: No distress.  Neck: No thyromegaly present.  Cardiovascular: Normal rate, regular rhythm and normal heart sounds. Exam reveals no gallop.  No murmur heard. Pulmonary/Chest: Effort normal and breath sounds normal. No respiratory distress. He has  no wheezes. He has no rales.  Abdominal: Soft. There is no tenderness.  Musculoskeletal:  1+ pitting edema in ankles only  Lymphadenopathy:    He has no cervical adenopathy.  Neurological:  Mild confusion  Skin: No rash noted.  Psychiatric: He has a normal mood and affect. His behavior is normal.          Assessment & Plan:

## 2017-02-20 NOTE — Assessment & Plan Note (Signed)
Mild and he reports no symptoms Nothing to suggest CHF Could be venous insufficiency but more likely related to the amlodipine Will cut the dose No diuretic needed

## 2017-03-20 DIAGNOSIS — L905 Scar conditions and fibrosis of skin: Secondary | ICD-10-CM | POA: Diagnosis not present

## 2017-03-20 DIAGNOSIS — C44629 Squamous cell carcinoma of skin of left upper limb, including shoulder: Secondary | ICD-10-CM | POA: Diagnosis not present

## 2017-03-20 DIAGNOSIS — D0462 Carcinoma in situ of skin of left upper limb, including shoulder: Secondary | ICD-10-CM | POA: Diagnosis not present

## 2017-05-16 ENCOUNTER — Ambulatory Visit: Payer: Medicare Other | Admitting: Internal Medicine

## 2017-05-16 DIAGNOSIS — Z8673 Personal history of transient ischemic attack (TIA), and cerebral infarction without residual deficits: Secondary | ICD-10-CM

## 2017-05-16 DIAGNOSIS — N401 Enlarged prostate with lower urinary tract symptoms: Secondary | ICD-10-CM | POA: Diagnosis not present

## 2017-05-16 DIAGNOSIS — I10 Essential (primary) hypertension: Secondary | ICD-10-CM

## 2017-05-16 DIAGNOSIS — F0280 Dementia in other diseases classified elsewhere without behavioral disturbance: Secondary | ICD-10-CM

## 2017-05-16 DIAGNOSIS — N183 Chronic kidney disease, stage 3 unspecified: Secondary | ICD-10-CM

## 2017-05-16 DIAGNOSIS — G301 Alzheimer's disease with late onset: Secondary | ICD-10-CM | POA: Diagnosis not present

## 2017-05-30 ENCOUNTER — Encounter: Payer: Self-pay | Admitting: Internal Medicine

## 2017-05-30 DIAGNOSIS — Z8673 Personal history of transient ischemic attack (TIA), and cerebral infarction without residual deficits: Secondary | ICD-10-CM | POA: Insufficient documentation

## 2017-05-30 NOTE — Patient Instructions (Signed)
Alzheimer Disease Caregiver Guide A person who has Alzheimer disease may not be able to take care of himself or herself. He or she may need help with simple tasks. The tips below can help you care for the person. Memory loss and confusion If the person is confused or cannot remember things:  Stay calm.  Respond with a short answer.  Avoid correcting him or her in a way that sounds like scolding.  Try not to take it personally, even if he or she forgets your name.  Behavior changes The person may go through behavior changes. This can include depression, anxiety, anger, or seeing things that are not there. When behavior changes:  Try not to take behavior changes personally.  Stay calm and patient.  Do not argue or try to convince the person about a specific point.  Know that these changes are part of the disease process. Try to work through it.  Tips to lessen frustration  Make appointments and do daily tasks when the person is at his or her best.  Take your time. Simple tasks may take longer. Allow plenty of time to complete tasks.  Limit choices for the person.  Involve the person in what you are doing.  Stick to a routine.  Avoid new or crowded places, if possible.  Use simple words, short sentences, and a calm voice. Only give 1 direction at a time.  Buy clothes and shoes that are easy to put on and take off.  Let people help if they offer. Home safety  Keep floors clear. Remove rugs, magazine racks, and floor lamps.  Keep hallways well lit.  Put a handrail and nonslip mat in the bathtub or shower.  Put childproof locks on cabinets that have dangerous items in them. These items include medicine, alcohol, guns, toxic cleaning items, sharp tools, matches, or lighters.  Place locks on doors where the person cannot see or reach them. This helps the person to not wander out of the house and get lost.  Be prepared for emergencies. Keep a list of emergency phone  numbers and addresses in a handy area. Plans for the future  Talk about finances. ? Talk about money management. People with Alzheimer disease have trouble managing their money as the disease gets worse. ? Get help from professional advisors about financial and legal matters.  Talk about future care. ? Choose a power of attorney. This is someone who can make decisions for the person with Alzheimer disease when he or she can no longer do so. ? Talk about driving and when it is the right time to stop. The person's doctor can help with this. ? If the person lives alone, make sure he or she is safe. Some people need extra help at home. Other people need more care at a nursing home or care center. Support groups Some benefits of joining a support group include:  Learning ways to manage stress.  Sharing experiences with others.  Getting emotional comfort and support.  Learning new caregiving skills as the disease progresses.  Knowing what community resources are available and taking advantage of them.  Get help if:  The person has a fever.  The person has a sudden behavior change that does not get better with calming strategies.  The person is unable to manage his or her living situation.  The person threatens you or anyone else, including himself or herself.  You are no longer able to care for the person. This information is not   intended to replace advice given to you by your health care provider. Make sure you discuss any questions you have with your health care provider. Document Released: 06/03/2011 Document Revised: 08/17/2015 Document Reviewed: 05/01/2011 Elsevier Interactive Patient Education  2017 Elsevier Inc.  

## 2017-05-30 NOTE — Assessment & Plan Note (Signed)
Reasonable control on Amlodipine If numbers stay high, consider increasing Amllodipine Monitor

## 2017-05-30 NOTE — Assessment & Plan Note (Signed)
Mainly cognitive Needs ALF supervision On Amlodipine

## 2017-05-30 NOTE — Assessment & Plan Note (Signed)
-   Continue Flomax 

## 2017-05-30 NOTE — Progress Notes (Signed)
Subjective:    Patient ID: Jacob Wade, male    DOB: 1924/02/14, 84 y.o.   MRN: [id number]  HPI  Routine visit for resident in [address] No new concerns from RNt. Resident concerned because his wife is in the hospital with the flu. He is currently on Tamiflu for prophylaxis He continues to go to [employer], resident intermittently confused. He is sleeping well. Appetite is good. Weight is stable. He is independent with care needs. He walks with a cane. He denies pain. He does reports some mild urinary incontinence but bowels okay.   Alzheimer: Mild cognitive needs. No meds. He needs ALF for care needs.  CKD: Last creat 1.12. Not on ACEI or ARB.  BPH: Mild incontinence, taking Flomax as prescribed.  HTN: His BP today is 157/81/ He is taking Amlodipine daily as prescribed.  History of Stroke: Needs ALF for cognitive supervision. Has some difficulty with gait but still independent with ADL's.  Review of Systems      Past Medical History:  Diagnosis Date  . Alzheimer's dementia   . Chronic kidney disease   . Chronic kidney disease, stage III (moderate) (HCC)   . Hypertension   . Skin cancer   . Stroke (cerebrum) [facility])     Current Outpatient Medications  Medication Sig Dispense Refill  . amLODipine (NORVASC) 5 MG tablet Take 1 tablet (5 mg total) by mouth daily. 30 tablet 11  . tamsulosin (FLOMAX) 0.4 MG CAPS capsule Take 1 capsule (0.4 mg total) by mouth daily. 30 capsule 0   No current facility-administered medications for this visit.     Allergies  Allergen Reactions  . Cephalosporins Other (See Comments)    Reaction:  Tremors   . Ciprofloxacin Other (See Comments)    Reaction:  Unknown     Family History  Problem Relation Age of Onset  . CAD Father   . Lung cancer Brother   . Kidney disease Neg Hx   . Prostate cancer Neg Hx     Social History   Socioeconomic History  . Marital status: Married    Spouse name: Not on file  . Number of children: 22  .  Years of education: Not on file  . Highest education level: Not on file  Social Needs  . Financial resource strain: Not on file  . Food insecurity - worry: Not on file  . Food insecurity - inability: Not on file  . Transportation needs - medical: Not on file  . Transportation needs - non-medical: Not on file  Occupational History  . Occupation: Academic librarian    Comment: [employer]  Tobacco Use  . Smoking status: Former Research scientist (life sciences)  . Smokeless tobacco: Never Used  . Tobacco comment: quit 1951  Substance and Sexual Activity  . Alcohol use: Yes    Alcohol/week: 0.6 oz    Types: 1 Shots of liquor per week    Comment: Bourbon before dinner  . Drug use: No  . Sexual activity: Not on file  Other Topics Concern  . Not on file  Social History Narrative   1 son, 1 daughter      Has living will    Daughter has health care POA   Has DNR --confirmed and written 08/08/16   No tube feeds   Hemlock Society     Constitutional: Denies fever, malaise, fatigue, headache or abrupt weight changes.  HEENT: Denies eye pain, eye redness, ear pain, ringing in the ears, wax buildup, runny nose,  nasal congestion, bloody nose, or sore throat. Respiratory: Denies difficulty breathing, shortness of breath, cough or sputum production.   Cardiovascular: Denies chest pain, chest tightness, palpitations or swelling in the hands or feet.  Gastrointestinal: Denies abdominal pain, bloating, constipation, diarrhea or blood in the stool.  GU: Pt reports mild urinary incontinence. Denies urgency, frequency, pain with urination, burning sensation, blood in urine, odor or discharge. Musculoskeletal: Pt reports difficulty with gait .Denies decrease in range of motion, muscle pain or joint pain and swelling.  Skin: Denies redness, rashes, lesions or ulcercations.  Neurological: Pt reports difficulty with memory. Denies dizziness, difficulty with speech or problems with balance and coordination.  Psych: Denies  anxiety, depression, SI/HI.  No other specific complaints in a complete review of systems (except as listed in HPI above).  Objective:   Physical Exam   BP (!) 157/81   Pulse 87   Temp 98.7 F (37.1 C)   Wt 175 lb (79.4 kg)   BMI 25.11 kg/m  Wt Readings from Last 3 Encounters:  05/30/17 175 lb (79.4 kg)  02/20/17 187 lb (84.8 kg)  11/20/16 173 lb (78.5 kg)    General: Appears his stated age, chronically ill appearing, in NAD. Cardiovascular: Normal rate and rhythm. S1,S2 noted.  No murmur, rubs or gallops noted. Trace nonpitting edema.  Pulmonary/Chest: Normal effort and positive vesicular breath sounds. No respiratory distress. No wheezes, rales or ronchi noted.  Abdomen: Soft and nontender. Normal bowel sounds.  Musculoskeletal: Slow, shuffled gait. Neurological: Alert, confused at times.   BMET    Component Value Date/Time   NA 136 08/24/2015 0617   K 4.5 08/22/2015 0326   CL 104 08/22/2015 0326   CO2 25 08/22/2015 0326   GLUCOSE 98 08/22/2015 0326   BUN 20 08/22/2015 0326   CREATININE 1.10 08/25/2015 0450   CALCIUM 7.7 (L) 08/22/2015 0326   GFRNONAA 57 (L) 08/25/2015 0450   GFRAA >60 08/25/2015 0450    Lipid Panel  No results found for: CHOL, TRIG, HDL, CHOLHDL, VLDL, LDLCALC  CBC    Component Value Date/Time   WBC 11.2 (H) 08/24/2015 0617   RBC 3.74 (L) 08/24/2015 0617   HGB 11.2 (L) 08/24/2015 0617   HCT 33.4 (L) 08/24/2015 0617   PLT 231 08/24/2015 0617   MCV 89.4 08/24/2015 0617   MCH 29.9 08/24/2015 0617   MCHC 33.4 08/24/2015 0617   RDW 14.2 08/24/2015 0617   LYMPHSABS 1.1 08/23/2015 0521   MONOABS 0.9 08/23/2015 0521   EOSABS 0.5 08/23/2015 0521   BASOSABS 0.0 08/23/2015 0521    Hgb A1C No results found for: HGBA1C         Assessment & Plan:

## 2017-05-30 NOTE — Assessment & Plan Note (Signed)
Mild cognitive and functional needs Consider Aricept if worse Needs ALF

## 2017-05-30 NOTE — Assessment & Plan Note (Signed)
Monitor creatinine 

## 2017-08-21 ENCOUNTER — Ambulatory Visit: Payer: Medicare Other | Admitting: Internal Medicine

## 2017-08-21 ENCOUNTER — Encounter: Payer: Self-pay | Admitting: Internal Medicine

## 2017-08-21 VITALS — BP 126/70 | HR 84 | Temp 98.0°F | Resp 16 | Wt 175.4 lb

## 2017-08-21 DIAGNOSIS — F0280 Dementia in other diseases classified elsewhere without behavioral disturbance: Secondary | ICD-10-CM | POA: Diagnosis not present

## 2017-08-21 DIAGNOSIS — I10 Essential (primary) hypertension: Secondary | ICD-10-CM | POA: Diagnosis not present

## 2017-08-21 DIAGNOSIS — N183 Chronic kidney disease, stage 3 unspecified: Secondary | ICD-10-CM

## 2017-08-21 DIAGNOSIS — N401 Enlarged prostate with lower urinary tract symptoms: Secondary | ICD-10-CM

## 2017-08-21 DIAGNOSIS — G301 Alzheimer's disease with late onset: Secondary | ICD-10-CM | POA: Diagnosis not present

## 2017-08-21 NOTE — Assessment & Plan Note (Signed)
GFR stable Mild stage 3

## 2017-08-21 NOTE — Assessment & Plan Note (Signed)
Mild with no sig functional or cognitive decline Doing well in AL

## 2017-08-21 NOTE — Assessment & Plan Note (Signed)
Seems to void okay with the tamsulosin

## 2017-08-21 NOTE — Assessment & Plan Note (Addendum)
BP Readings from Last 3 Encounters:  08/21/17 126/70  05/30/17 (!) 157/81  02/20/17 134/74   Good control No changes needed

## 2017-08-21 NOTE — Progress Notes (Signed)
Subjective:    Patient ID: Jacob Wade, male    DOB: [birth year], 84 y.o.   MRN: [id number]  HPI Visit in [place] apartment at [facility] for follow up of chronic health conditions Reviewed status with Luellen Pucker RN Doing fairly well He is satified  Memory seems stable Still goes to [facility] day care Still independent with ADLs Generally continent  Enjoys eating Weight stable over a year---but up a lot from baseline He isn't concerned --though wife mentions  Voids okay Nocturia x 1 at most Seems to empty bladder fine  No chest pain No palpitations No SOB No dizziness  Reviewed last blood work Creatinine stable No uremic symptoms  Current Outpatient Medications on File Prior to Visit  Medication Sig Dispense Refill  . amLODipine (NORVASC) 5 MG tablet Take 1 tablet (5 mg total) by mouth daily. 30 tablet 11  . tamsulosin (FLOMAX) 0.4 MG CAPS capsule Take 1 capsule (0.4 mg total) by mouth daily. 30 capsule 0   No current facility-administered medications on file prior to visit.     Allergies  Allergen Reactions  . Cephalosporins Other (See Comments)    Reaction:  Tremors   . Ciprofloxacin Other (See Comments)    Reaction:  Unknown     Past Medical History:  Diagnosis Date  . Alzheimer's dementia   . Chronic kidney disease   . Chronic kidney disease, stage III (moderate) (HCC)   . Hypertension   . Skin cancer   . Stroke (cerebrum) [facility])     Past Surgical History:  Procedure Laterality Date  . head hematoma surgery     from fall  . MOHS SURGERY      Family History  Problem Relation Age of Onset  . CAD Father   . Lung cancer Brother   . Kidney disease Neg Hx   . Prostate cancer Neg Hx     Social History   Socioeconomic History  . Marital status: Married    Spouse name: Not on file  . Number of children: 22  . Years of education: Not on file  . Highest education level: Not on file  Occupational History  . Occupation: Academic librarian    Comment:  [employer]  Social Needs  . Financial resource strain: Not on file  . Food insecurity:    Worry: Not on file    Inability: Not on file  . Transportation needs:    Medical: Not on file    Non-medical: Not on file  Tobacco Use  . Smoking status: Former Research scientist (life sciences)  . Smokeless tobacco: Never Used  . Tobacco comment: quit 1951  Substance and Sexual Activity  . Alcohol use: Yes    Alcohol/week: 0.6 oz    Types: 1 Shots of liquor per week    Comment: Bourbon before dinner  . Drug use: No  . Sexual activity: Not on file  Lifestyle  . Physical activity:    Days per week: Not on file    Minutes per session: Not on file  . Stress: Not on file  Relationships  . Social connections:    Talks on phone: Not on file    Gets together: Not on file    Attends religious service: Not on file    Active member of club or organization: Not on file    Attends meetings of clubs or organizations: Not on file    Relationship status: Not on file  . Intimate partner violence:    Fear of current  or ex partner: Not on file    Emotionally abused: Not on file    Physically abused: Not on file    Forced sexual activity: Not on file  Other Topics Concern  . Not on file  Social History Narrative   1 son, 1 daughter      Has living will    Daughter has health care POA   Has DNR --confirmed and written 08/08/16   No tube feeds   Hemlock Society   Review of Systems  Sleeps well Bowels move well No rash or skin problems     Objective:   Physical Exam  Constitutional: No distress.  Neck: No thyromegaly present.  Cardiovascular: Normal rate, regular rhythm and normal heart sounds. Exam reveals no gallop.  No murmur heard. Respiratory: Effort normal and breath sounds normal. No respiratory distress. He has no wheezes. He has no rales.  GI: Soft. There is no tenderness.  Musculoskeletal: He exhibits no edema or tenderness.  Lymphadenopathy:    He has no cervical adenopathy.  Psychiatric: He has a  normal mood and affect. His behavior is normal.           Assessment & Plan:

## 2017-11-10 DIAGNOSIS — H25013 Cortical age-related cataract, bilateral: Secondary | ICD-10-CM | POA: Diagnosis not present

## 2017-11-19 ENCOUNTER — Ambulatory Visit: Payer: Medicare Other | Admitting: Internal Medicine

## 2017-11-19 DIAGNOSIS — N183 Chronic kidney disease, stage 3 unspecified: Secondary | ICD-10-CM

## 2017-11-19 DIAGNOSIS — N401 Enlarged prostate with lower urinary tract symptoms: Secondary | ICD-10-CM | POA: Diagnosis not present

## 2017-11-19 DIAGNOSIS — Z8673 Personal history of transient ischemic attack (TIA), and cerebral infarction without residual deficits: Secondary | ICD-10-CM | POA: Diagnosis not present

## 2017-11-19 DIAGNOSIS — I10 Essential (primary) hypertension: Secondary | ICD-10-CM | POA: Diagnosis not present

## 2017-11-19 DIAGNOSIS — F0280 Dementia in other diseases classified elsewhere without behavioral disturbance: Secondary | ICD-10-CM | POA: Diagnosis not present

## 2017-11-19 DIAGNOSIS — G301 Alzheimer's disease with late onset: Secondary | ICD-10-CM | POA: Diagnosis not present

## 2017-11-21 ENCOUNTER — Encounter: Payer: Self-pay | Admitting: Internal Medicine

## 2017-11-21 NOTE — Assessment & Plan Note (Signed)
Continue going [facility] No medications at this time Appreciate ALF care

## 2017-11-21 NOTE — Assessment & Plan Note (Signed)
Continue Amlodipine Will monitor

## 2017-11-21 NOTE — Progress Notes (Signed)
Subjective:    Patient ID: Jacob Wade, male    DOB: February 01, 1924, 84 y.o.   MRN: [id number]  HPI:  Due for routine follow up with resident in [address] Reviewed with RN, no new concerns Continues to go to [facility] daily Sleeps well, walks without device Appetite good, weight up a few pounds Bowels okay, voids fine Denies pain, reflux or SOB.  HTN: BP reasonable on Amlodipine.  BPH: Voids fine on Flomax.  Alzheimer's: Chronic but stable off meds.  CKD 3: Creatinine 1.26. No ACEI or ARB at this time.  Review of Systems      Past Medical History:  Diagnosis Date  . Alzheimer's dementia   . Chronic kidney disease   . Chronic kidney disease, stage III (moderate) (HCC)   . Hypertension   . Skin cancer   . Stroke (cerebrum) [facility])     Current Outpatient Medications  Medication Sig Dispense Refill  . amLODipine (NORVASC) 5 MG tablet Take 1 tablet (5 mg total) by mouth daily. 30 tablet 11  . tamsulosin (FLOMAX) 0.4 MG CAPS capsule Take 1 capsule (0.4 mg total) by mouth daily. 30 capsule 0   No current facility-administered medications for this visit.     Allergies  Allergen Reactions  . Cephalosporins Other (See Comments)    Reaction:  Tremors   . Ciprofloxacin Other (See Comments)    Reaction:  Unknown     Family History  Problem Relation Age of Onset  . CAD Father   . Lung cancer Brother   . Kidney disease Neg Hx   . Prostate cancer Neg Hx     Social History   Socioeconomic History  . Marital status: Married    Spouse name: Not on file  . Number of children: 22  . Years of education: Not on file  . Highest education level: Not on file  Occupational History  . Occupation: Academic librarian    Comment: [employer]  Social Needs  . Financial resource strain: Not on file  . Food insecurity:    Worry: Not on file    Inability: Not on file  . Transportation needs:    Medical: Not on file    Non-medical: Not on file  Tobacco Use  . Smoking status:  Former Research scientist (life sciences)  . Smokeless tobacco: Never Used  . Tobacco comment: quit 1951  Substance and Sexual Activity  . Alcohol use: Yes    Alcohol/week: 1.0 standard drinks    Types: 1 Shots of liquor per week    Comment: Bourbon before dinner  . Drug use: No  . Sexual activity: Not on file  Lifestyle  . Physical activity:    Days per week: Not on file    Minutes per session: Not on file  . Stress: Not on file  Relationships  . Social connections:    Talks on phone: Not on file    Gets together: Not on file    Attends religious service: Not on file    Active member of club or organization: Not on file    Attends meetings of clubs or organizations: Not on file    Relationship status: Not on file  . Intimate partner violence:    Fear of current or ex partner: Not on file    Emotionally abused: Not on file    Physically abused: Not on file    Forced sexual activity: Not on file  Other Topics Concern  . Not on file  Social History  Narrative   1 son, 1 daughter      Has living will    Daughter has health care POA   Has DNR --confirmed and written 08/08/16   No tube feeds   Hemlock Society     Constitutional: Denies fever, malaise, fatigue, headache or abrupt weight changes.  HEENT: Denies eye pain, eye redness, ear pain, ringing in the ears, wax buildup, runny nose, nasal congestion, bloody nose, or sore throat. Respiratory: Denies difficulty breathing, shortness of breath, cough or sputum production.   Cardiovascular: Denies chest pain, chest tightness, palpitations or swelling in the hands or feet.  Gastrointestinal: Denies abdominal pain, bloating, constipation, diarrhea or blood in the stool.  GU: Denies urgency, frequency, pain with urination, burning sensation, blood in urine, odor or discharge. Musculoskeletal: Denies decrease in range of motion, difficulty with gait, muscle pain or joint pain and swelling.  Skin: Denies redness, rashes, lesions or ulcercations.    Neurological: Pt reports difficulty with memory. Denies dizziness, difficulty with speech or problems with balance and coordination.  Psych: Denies anxiety, depression, SI/HI.  No other specific complaints in a complete review of systems (except as listed in HPI above).  Objective:   Physical Exam   BP (!) 152/80   Pulse 72   Temp (!) 87.8 F (31 C)   Resp 16   Wt 178 lb 3.2 oz (80.8 kg)   BMI 25.57 kg/m  Wt Readings from Last 3 Encounters:  11/21/17 178 lb 3.2 oz (80.8 kg)  08/21/17 175 lb 6.4 oz (79.6 kg)  05/30/17 175 lb (79.4 kg)    General: Appears his stated age, in NAD. Neck:  No nodes or JVD.  Cardiovascular: Normal rate and rhythm. S1,S2 noted.  No murmur, rubs or gallops noted. Trace BLE edema. Pulmonary/Chest: Normal effort and positive vesicular breath sounds. No respiratory distress. No wheezes, rales or ronchi noted.  Abdomen: Soft and nontender. Normal bowel sounds.  Musculoskeletal: Gait slow but steady without device.  Neurological: Alert. Confused.   BMET    Component Value Date/Time   NA 136 08/24/2015 0617   K 4.5 08/22/2015 0326   CL 104 08/22/2015 0326   CO2 25 08/22/2015 0326   GLUCOSE 98 08/22/2015 0326   BUN 20 08/22/2015 0326   CREATININE 1.10 08/25/2015 0450   CALCIUM 7.7 (L) 08/22/2015 0326   GFRNONAA 57 (L) 08/25/2015 0450   GFRAA >60 08/25/2015 0450    Lipid Panel  No results found for: CHOL, TRIG, HDL, CHOLHDL, VLDL, LDLCALC  CBC    Component Value Date/Time   WBC 11.2 (H) 08/24/2015 0617   RBC 3.74 (L) 08/24/2015 0617   HGB 11.2 (L) 08/24/2015 0617   HCT 33.4 (L) 08/24/2015 0617   PLT 231 08/24/2015 0617   MCV 89.4 08/24/2015 0617   MCH 29.9 08/24/2015 0617   MCHC 33.4 08/24/2015 0617   RDW 14.2 08/24/2015 0617   LYMPHSABS 1.1 08/23/2015 0521   MONOABS 0.9 08/23/2015 0521   EOSABS 0.5 08/23/2015 0521   BASOSABS 0.0 08/23/2015 0521    Hgb A1C No results found for: HGBA1C         Assessment & Plan:

## 2017-11-21 NOTE — Assessment & Plan Note (Signed)
Mainly cognitive Appreciate ALF care Continue Amlodipine

## 2017-11-21 NOTE — Assessment & Plan Note (Signed)
Will monitor creatinine yearly

## 2017-11-21 NOTE — Assessment & Plan Note (Signed)
-   Continue Flomax 

## 2018-01-29 ENCOUNTER — Encounter: Payer: Self-pay | Admitting: Internal Medicine

## 2018-01-29 DIAGNOSIS — I10 Essential (primary) hypertension: Secondary | ICD-10-CM | POA: Diagnosis not present

## 2018-02-12 ENCOUNTER — Ambulatory Visit: Payer: Medicare Other | Admitting: Internal Medicine

## 2018-02-12 ENCOUNTER — Encounter: Payer: Self-pay | Admitting: Internal Medicine

## 2018-02-12 VITALS — BP 152/86 | HR 78 | Resp 18 | Wt 180.4 lb

## 2018-02-12 DIAGNOSIS — I10 Essential (primary) hypertension: Secondary | ICD-10-CM

## 2018-02-12 DIAGNOSIS — N183 Chronic kidney disease, stage 3 unspecified: Secondary | ICD-10-CM

## 2018-02-12 DIAGNOSIS — N401 Enlarged prostate with lower urinary tract symptoms: Secondary | ICD-10-CM

## 2018-02-12 DIAGNOSIS — G301 Alzheimer's disease with late onset: Secondary | ICD-10-CM | POA: Diagnosis not present

## 2018-02-12 DIAGNOSIS — F0280 Dementia in other diseases classified elsewhere without behavioral disturbance: Secondary | ICD-10-CM | POA: Diagnosis not present

## 2018-02-12 NOTE — Assessment & Plan Note (Signed)
Doing okay functionally Now on low dose namenda Still regular at [employer] day program Maintaining functional independence in his apartment (with wife)

## 2018-02-12 NOTE — Assessment & Plan Note (Signed)
No symptoms Would not be more aggressive with BP given his age

## 2018-02-12 NOTE — Assessment & Plan Note (Signed)
BP Readings from Last 3 Encounters:  02/12/18 (!) 152/86  11/21/17 (!) 152/80  08/21/17 126/70   BP variable but generally okay for age Would not change meds No need to check BP so often

## 2018-02-12 NOTE — Assessment & Plan Note (Addendum)
Minimal symptoms No Rx

## 2018-02-12 NOTE — Progress Notes (Signed)
Subjective:    Patient ID: HAL NORRINGTON, male    DOB: 03/08/1924, 84 y.o.   MRN: [id number]  HPI Visit in [facility] room for review of chronic medical conditions Reviewed status with Luellen Pucker RN Wife is here  There has been some concern for his BP--getting checked daily Running 129/70-146/82 No chest pain No SOB No dizziness or syncope No headaches  Ongoing memory issues Wife notes some decline Still goes daily to [employer] day program Now on memantine  Voids okay No nocturia Known CKD 3 ---due for labs   Current Outpatient Medications on File Prior to Visit  Medication Sig Dispense Refill  . amLODipine (NORVASC) 5 MG tablet Take 1 tablet (5 mg total) by mouth daily. 30 tablet 11  . memantine (NAMENDA) 5 MG tablet Take 5 mg by mouth 2 (two) times daily.    . tamsulosin (FLOMAX) 0.4 MG CAPS capsule Take 1 capsule (0.4 mg total) by mouth daily. 30 capsule 0   No current facility-administered medications on file prior to visit.     Allergies  Allergen Reactions  . Cephalosporins Other (See Comments)    Reaction:  Tremors   . Ciprofloxacin Other (See Comments)    Reaction:  Unknown     Past Medical History:  Diagnosis Date  . Alzheimer's dementia ([place])   . Chronic kidney disease   . Chronic kidney disease, stage III (moderate) (HCC)   . Hypertension   . Skin cancer   . Stroke (cerebrum) [facility])     Past Surgical History:  Procedure Laterality Date  . head hematoma surgery     from fall  . MOHS SURGERY      Family History  Problem Relation Age of Onset  . CAD Father   . Lung cancer Brother   . Kidney disease Neg Hx   . Prostate cancer Neg Hx     Social History   Socioeconomic History  . Marital status: Married    Spouse name: Not on file  . Number of children: 22  . Years of education: Not on file  . Highest education level: Not on file  Occupational History  . Occupation: Academic librarian    Comment: [employer]  Social Needs  . Financial  resource strain: Not on file  . Food insecurity:    Worry: Not on file    Inability: Not on file  . Transportation needs:    Medical: Not on file    Non-medical: Not on file  Tobacco Use  . Smoking status: Former Research scientist (life sciences)  . Smokeless tobacco: Never Used  . Tobacco comment: quit 1951  Substance and Sexual Activity  . Alcohol use: Yes    Alcohol/week: 1.0 standard drinks    Types: 1 Shots of liquor per week    Comment: Bourbon before dinner  . Drug use: No  . Sexual activity: Not on file  Lifestyle  . Physical activity:    Days per week: Not on file    Minutes per session: Not on file  . Stress: Not on file  Relationships  . Social connections:    Talks on phone: Not on file    Gets together: Not on file    Attends religious service: Not on file    Active member of club or organization: Not on file    Attends meetings of clubs or organizations: Not on file    Relationship status: Not on file  . Intimate partner violence:    Fear of current  or ex partner: Not on file    Emotionally abused: Not on file    Physically abused: Not on file    Forced sexual activity: Not on file  Other Topics Concern  . Not on file  Social History Narrative   1 son, 1 daughter      Has living will    Daughter has health care POA   Has DNR --confirmed and written 08/08/16   No tube feeds   Hemlock Society   Review of Systems  Appetite is good Sleeps fine Weight stable Bowels are good No skin problems No heartburn     Objective:   Physical Exam  Constitutional: He appears well-developed.  Cardiovascular: Normal rate, regular rhythm and normal heart sounds.  Respiratory: Effort normal. No respiratory distress. He has no wheezes. He has no rales.  GI: There is no tenderness.  Musculoskeletal: He exhibits no edema.  Psychiatric: He has a normal mood and affect. His behavior is normal.           Assessment & Plan:

## 2018-05-27 ENCOUNTER — Ambulatory Visit: Payer: Medicare Other | Admitting: Internal Medicine

## 2018-05-27 DIAGNOSIS — N401 Enlarged prostate with lower urinary tract symptoms: Secondary | ICD-10-CM

## 2018-05-27 DIAGNOSIS — G301 Alzheimer's disease with late onset: Secondary | ICD-10-CM

## 2018-05-27 DIAGNOSIS — N183 Chronic kidney disease, stage 3 unspecified: Secondary | ICD-10-CM

## 2018-05-27 DIAGNOSIS — Z8673 Personal history of transient ischemic attack (TIA), and cerebral infarction without residual deficits: Secondary | ICD-10-CM | POA: Diagnosis not present

## 2018-05-27 DIAGNOSIS — I10 Essential (primary) hypertension: Secondary | ICD-10-CM | POA: Diagnosis not present

## 2018-05-27 DIAGNOSIS — F0280 Dementia in other diseases classified elsewhere without behavioral disturbance: Secondary | ICD-10-CM | POA: Diagnosis not present

## 2018-06-05 ENCOUNTER — Encounter: Payer: Self-pay | Admitting: Internal Medicine

## 2018-06-05 NOTE — Assessment & Plan Note (Signed)
Controlled on Amlodipine Will monitor 

## 2018-06-05 NOTE — Assessment & Plan Note (Signed)
-   Continue Flomax 

## 2018-06-05 NOTE — Assessment & Plan Note (Signed)
Stable on Namenda Appreciate ALF care 

## 2018-06-05 NOTE — Assessment & Plan Note (Signed)
No ACEI/ARB Will monitor kidney function yearly

## 2018-06-05 NOTE — Assessment & Plan Note (Signed)
Mostly cognitive No ASA, statin at this time Appreciate ALF care

## 2018-06-05 NOTE — Patient Instructions (Signed)

## 2018-06-05 NOTE — Progress Notes (Signed)
Subjective:    Patient ID: Jacob Wade, male    DOB: [birth year], 85 y.o.   MRN: [id number]  HPI  Routine follow up for resident in [address] Reviewed with RN, no new concerns Resident denies concerns [place] during the week. Sleeps well. Independent with ADL's. Walks without device.  Appetite good. Weight up 2 lbs. No urinary issues. Bowels okay Denies pain, reflux or SOB  BPH: Voids fine on Flomax  Alxheimers: Mild cognitive issues. He is taking Namenda as prescribed.  HTN: His BP today is 154/77. He is taking Amlodipine as prescribed.   CKD 3: Last creatinine 1.36, GFR 45. Not on an ACEIARB  Review of Systems      Past Medical History:  Diagnosis Date  . Alzheimer's dementia ([place])   . Chronic kidney disease   . Chronic kidney disease, stage III (moderate) (HCC)   . Hypertension   . Skin cancer   . Stroke (cerebrum) [facility])     Current Outpatient Medications  Medication Sig Dispense Refill  . amLODipine (NORVASC) 5 MG tablet Take 1 tablet (5 mg total) by mouth daily. 30 tablet 11  . memantine (NAMENDA) 5 MG tablet Take 5 mg by mouth 2 (two) times daily.    . tamsulosin (FLOMAX) 0.4 MG CAPS capsule Take 1 capsule (0.4 mg total) by mouth daily. 30 capsule 0   No current facility-administered medications for this visit.     Allergies  Allergen Reactions  . Cephalosporins Other (See Comments)    Reaction:  Tremors   . Ciprofloxacin Other (See Comments)    Reaction:  Unknown     Family History  Problem Relation Age of Onset  . CAD Father   . Lung cancer Brother   . Kidney disease Neg Hx   . Prostate cancer Neg Hx     Social History   Socioeconomic History  . Marital status: Married    Spouse name: Not on file  . Number of children: 22  . Years of education: Not on file  . Highest education level: Not on file  Occupational History  . Occupation: Academic librarian    Comment: [employer]  Social Needs  . Financial resource  strain: Not on file  . Food insecurity:    Worry: Not on file    Inability: Not on file  . Transportation needs:    Medical: Not on file    Non-medical: Not on file  Tobacco Use  . Smoking status: Former Research scientist (life sciences)  . Smokeless tobacco: Never Used  . Tobacco comment: quit 1951  Substance and Sexual Activity  . Alcohol use: Yes    Alcohol/week: 1.0 standard drinks    Types: 1 Shots of liquor per week    Comment: Bourbon before dinner  . Drug use: No  . Sexual activity: Not on file  Lifestyle  . Physical activity:    Days per week: Not on file    Minutes per session: Not on file  . Stress: Not on file  Relationships  . Social connections:    Talks on phone: Not on file    Gets together: Not on file    Attends religious service: Not on file    Active member of club or organization: Not on file    Attends meetings of clubs or organizations: Not on file    Relationship status: Not on file  . Intimate partner violence:    Fear of current or ex partner: Not on  file    Emotionally abused: Not on file    Physically abused: Not on file    Forced sexual activity: Not on file  Other Topics Concern  . Not on file  Social History Narrative   1 son, 1 daughter      Has living will    Daughter has health care POA   Has DNR --confirmed and written 08/08/16   No tube feeds   Hemlock Society     Constitutional: Denies fever, malaise, fatigue, headache or abrupt weight changes.  HEENT: Denies eye pain, eye redness, ear pain, ringing in the ears, wax buildup, runny nose, nasal congestion, bloody nose, or sore throat. Respiratory: Denies difficulty breathing, shortness of breath, cough or sputum production.   Cardiovascular: Denies chest pain, chest tightness, palpitations or swelling in the hands or feet.  Gastrointestinal: Denies abdominal pain, bloating, constipation, diarrhea or blood in the stool.  GU: Denies urgency, frequency, pain with urination, burning sensation, blood in urine,  odor or discharge. Musculoskeletal: Denies decrease in range of motion, difficulty with gait, muscle pain or joint pain and swelling.  Skin: Denies redness, rashes, lesions or ulcercations.  Neurological: Denies dizziness, difficulty with memory, difficulty with speech or problems with balance and coordination.  Psych: Denies anxiety, depression, SI/HI.  No other specific complaints in a complete review of systems (except as listed in HPI above).  Objective:   Physical Exam   BP (!) 154/77   Pulse 74   Temp 98 F (36.7 C)   Resp 18   Wt 182 lb 8 oz (82.8 kg)   BMI 26.19 kg/m  Wt Readings from Last 3 Encounters:  06/05/18 182 lb 8 oz (82.8 kg)  02/12/18 180 lb 6.4 oz (81.8 kg)  11/21/17 178 lb 3.2 oz (80.8 kg)    General: Appears his stated age, in NAD. Cardiovascular: Normal rate and rhythm. S1,S2 noted.  No murmur, rubs or gallops noted.  Trace BLE edema.  Pulmonary/Chest: Normal effort and positive vesicular breath sounds. No respiratory distress. No wheezes, rales or ronchi noted.  Abdomen: Soft and nontender. Normal bowel sounds.  Musculoskeletal:  No difficulty with gait.  Neurological: Alert and oriented. HOH Psychiatric: Mood and affect normal.   BMET    Component Value Date/Time   NA 136 08/24/2015 0617   K 4.5 08/22/2015 0326   CL 104 08/22/2015 0326   CO2 25 08/22/2015 0326   GLUCOSE 98 08/22/2015 0326   BUN 20 08/22/2015 0326   CREATININE 1.10 08/25/2015 0450   CALCIUM 7.7 (L) 08/22/2015 0326   GFRNONAA 57 (L) 08/25/2015 0450   GFRAA >60 08/25/2015 0450    Lipid Panel  No results found for: CHOL, TRIG, HDL, CHOLHDL, VLDL, LDLCALC  CBC    Component Value Date/Time   WBC 11.2 (H) 08/24/2015 0617   RBC 3.74 (L) 08/24/2015 0617   HGB 11.2 (L) 08/24/2015 0617   HCT 33.4 (L) 08/24/2015 0617   PLT 231 08/24/2015 0617   MCV 89.4 08/24/2015 0617   MCH 29.9 08/24/2015 0617   MCHC 33.4 08/24/2015 0617   RDW 14.2 08/24/2015 0617   LYMPHSABS 1.1  08/23/2015 0521   MONOABS 0.9 08/23/2015 0521   EOSABS 0.5 08/23/2015 0521   BASOSABS 0.0 08/23/2015 0521    Hgb A1C No results found for: HGBA1C        Assessment & Plan:

## 2018-08-06 ENCOUNTER — Ambulatory Visit: Payer: Medicare Other | Admitting: Internal Medicine

## 2018-08-06 ENCOUNTER — Encounter: Payer: Self-pay | Admitting: Internal Medicine

## 2018-08-06 VITALS — BP 132/68 | HR 70 | Temp 98.4°F | Resp 18 | Wt 177.6 lb

## 2018-08-06 DIAGNOSIS — N183 Chronic kidney disease, stage 3 unspecified: Secondary | ICD-10-CM

## 2018-08-06 DIAGNOSIS — I10 Essential (primary) hypertension: Secondary | ICD-10-CM | POA: Diagnosis not present

## 2018-08-06 DIAGNOSIS — N401 Enlarged prostate with lower urinary tract symptoms: Secondary | ICD-10-CM

## 2018-08-06 DIAGNOSIS — G301 Alzheimer's disease with late onset: Secondary | ICD-10-CM

## 2018-08-06 DIAGNOSIS — F0280 Dementia in other diseases classified elsewhere without behavioral disturbance: Secondary | ICD-10-CM

## 2018-08-06 NOTE — Assessment & Plan Note (Signed)
BP Readings from Last 3 Encounters:  08/06/18 132/68  06/05/18 (!) 154/77  02/12/18 (!) 152/86   Good control No changes

## 2018-08-06 NOTE — Progress Notes (Signed)
Subjective:    Patient ID: Jacob Wade, male    DOB: 09-13-1923, 85 y.o.   MRN: [id number]  HPI Visit in assisted living apartment for review of chronic health conditions Reviewed status with Luellen Pucker RN Wife is here as well  Not going [place] due to the pandemic This has been okay They have adjusted to the social distancing okay No change in function--still independent with ADLs  No chest pain  No SOB No palpitations No dizziness or syncope No edema  Voids okay No obvious trouble emptying bladder GFR ~45  Current Outpatient Medications on File Prior to Visit  Medication Sig Dispense Refill  . amLODipine (NORVASC) 5 MG tablet Take 1 tablet (5 mg total) by mouth daily. 30 tablet 11  . memantine (NAMENDA) 5 MG tablet Take 5 mg by mouth 2 (two) times daily.    . tamsulosin (FLOMAX) 0.4 MG CAPS capsule Take 1 capsule (0.4 mg total) by mouth daily. 30 capsule 0   No current facility-administered medications on file prior to visit.     Allergies  Allergen Reactions  . Cephalosporins Other (See Comments)    Reaction:  Tremors   . Ciprofloxacin Other (See Comments)    Reaction:  Unknown     Past Medical History:  Diagnosis Date  . Alzheimer's dementia ([place])   . Chronic kidney disease   . Chronic kidney disease, stage III (moderate) (HCC)   . Hypertension   . Skin cancer   . Stroke (cerebrum) [facility])     Past Surgical History:  Procedure Laterality Date  . head hematoma surgery     from fall  . MOHS SURGERY      Family History  Problem Relation Age of Onset  . CAD Father   . Lung cancer Brother   . Kidney disease Neg Hx   . Prostate cancer Neg Hx     Social History   Socioeconomic History  . Marital status: Married    Spouse name: Not on file  . Number of children: 22  . Years of education: Not on file  . Highest education level: Not on file  Occupational History  . Occupation: Academic librarian    Comment: [employer]  Social Needs  .  Financial resource strain: Not on file  . Food insecurity:    Worry: Not on file    Inability: Not on file  . Transportation needs:    Medical: Not on file    Non-medical: Not on file  Tobacco Use  . Smoking status: Former Research scientist (life sciences)  . Smokeless tobacco: Never Used  . Tobacco comment: quit 1951  Substance and Sexual Activity  . Alcohol use: Yes    Alcohol/week: 1.0 standard drinks    Types: 1 Shots of liquor per week    Comment: Bourbon before dinner  . Drug use: No  . Sexual activity: Not on file  Lifestyle  . Physical activity:    Days per week: Not on file    Minutes per session: Not on file  . Stress: Not on file  Relationships  . Social connections:    Talks on phone: Not on file    Gets together: Not on file    Attends religious service: Not on file    Active member of club or organization: Not on file    Attends meetings of clubs or organizations: Not on file    Relationship status: Not on file  . Intimate partner violence:    Fear  of current or ex partner: Not on file    Emotionally abused: Not on file    Physically abused: Not on file    Forced sexual activity: Not on file  Other Topics Concern  . Not on file  Social History Narrative   1 son, 1 daughter      Has living will    Daughter has health care POA   Has DNR --confirmed and written 08/08/16   No tube feeds   Hemlock Society   Review of Systems No incontinence Appetite is good Weight is stable Sleeps well Bowels are fine    Objective:   Physical Exam  Constitutional: He appears well-developed. No distress.  HENT:  HOH  Neck: No thyromegaly present.  Neurological: He is alert.  Skin: No rash noted. No erythema.  Psychiatric: He has a normal mood and affect. His behavior is normal.           Assessment & Plan:

## 2018-08-06 NOTE — Assessment & Plan Note (Signed)
Mild and hasn't really progressed Stable functional status Hopefully can resume time at the [facility] before too long

## 2018-08-06 NOTE — Assessment & Plan Note (Signed)
Seems to void okay

## 2018-08-06 NOTE — Assessment & Plan Note (Signed)
GFR in 73's Given age and reasonable BP control, I would not try ACEI or ARB

## 2018-09-30 ENCOUNTER — Ambulatory Visit: Payer: Medicare Other | Admitting: Internal Medicine

## 2018-09-30 VITALS — BP 140/90 | HR 71

## 2018-09-30 DIAGNOSIS — R42 Dizziness and giddiness: Secondary | ICD-10-CM

## 2018-09-30 DIAGNOSIS — R200 Anesthesia of skin: Secondary | ICD-10-CM | POA: Diagnosis not present

## 2018-09-30 DIAGNOSIS — R29898 Other symptoms and signs involving the musculoskeletal system: Secondary | ICD-10-CM

## 2018-10-04 ENCOUNTER — Encounter: Payer: Self-pay | Admitting: Internal Medicine

## 2018-10-04 NOTE — Progress Notes (Signed)
Subjective:    Patient ID: Jacob Wade, male    DOB: March 04, 1924, 85 y.o.   MRN: [id number]  HPI  Asked to check resident in ALF apt RN reports yesterday, resident kept motioning like he was dizzy. He also experienced left arm weakness and numbness. He seemed weak but had not had a fall He has a history of strokes He was preemptively started on ASA 81 mg  RN reports he is some better this morning, still exhibiting some left arm numbness and weakness. RN also reports he woke up with a black eye this morning, no know fall  Review of Systems      Past Medical History:  Diagnosis Date  . Alzheimer's dementia ([place])   . Chronic kidney disease   . Chronic kidney disease, stage III (moderate) (HCC)   . Hypertension   . Skin cancer   . Stroke (cerebrum) [facility])     Current Outpatient Medications  Medication Sig Dispense Refill  . amLODipine (NORVASC) 5 MG tablet Take 1 tablet (5 mg total) by mouth daily. 30 tablet 11  . aspirin EC 81 MG tablet Take 81 mg by mouth daily.    . memantine (NAMENDA) 5 MG tablet Take 5 mg by mouth 2 (two) times daily.    . tamsulosin (FLOMAX) 0.4 MG CAPS capsule Take 1 capsule (0.4 mg total) by mouth daily. 30 capsule 0   No current facility-administered medications for this visit.     Allergies  Allergen Reactions  . Cephalosporins Other (See Comments)    Reaction:  Tremors   . Ciprofloxacin Other (See Comments)    Reaction:  Unknown     Family History  Problem Relation Age of Onset  . CAD Father   . Lung cancer Brother   . Kidney disease Neg Hx   . Prostate cancer Neg Hx     Social History   Socioeconomic History  . Marital status: Married    Spouse name: Not on file  . Number of children: 22  . Years of education: Not on file  . Highest education level: Not on file  Occupational History  . Occupation: Academic librarian    Comment: [employer]  Social Needs  . Financial resource strain: Not on file  . Food insecurity   Worry: Not on file    Inability: Not on file  . Transportation needs    Medical: Not on file    Non-medical: Not on file  Tobacco Use  . Smoking status: Former Research scientist (life sciences)  . Smokeless tobacco: Never Used  . Tobacco comment: quit 1951  Substance and Sexual Activity  . Alcohol use: Yes    Alcohol/week: 1.0 standard drinks    Types: 1 Shots of liquor per week    Comment: Bourbon before dinner  . Drug use: No  . Sexual activity: Not on file  Lifestyle  . Physical activity    Days per week: Not on file    Minutes per session: Not on file  . Stress: Not on file  Relationships  . Social Herbalist on phone: Not on file    Gets together: Not on file    Attends religious service: Not on file    Active member of club or organization: Not on file    Attends meetings of clubs or organizations: Not on file    Relationship status: Not on file  . Intimate partner violence    Fear of current or ex partner: Not on  file    Emotionally abused: Not on file    Physically abused: Not on file    Forced sexual activity: Not on file  Other Topics Concern  . Not on file  Social History Narrative   1 son, 1 daughter      Has living will    Daughter has health care POA   Has DNR --confirmed and written 08/08/16   No tube feeds   Hemlock Society     Constitutional: Denies fever, malaise, fatigue, headache or abrupt weight changes.  HEENT: Denies eye pain, eye redness, ear pain, ringing in the ears, wax buildup, runny nose, nasal congestion, bloody nose, or sore throat. Respiratory: Denies difficulty breathing, shortness of breath, cough or sputum production.   Cardiovascular: Denies chest pain, chest tightness, palpitations or swelling in the hands or feet.  Gastrointestinal: Denies abdominal pain, bloating, constipation, diarrhea or blood in the stool.  GU: Denies urgency, frequency, pain with urination, burning sensation, blood in urine, odor or discharge. Musculoskeletal: Denies  decrease in range of motion, difficulty with gait, muscle pain or joint pain and swelling.  Skin: Denies redness, rashes, lesions or ulcercations.  Neurological: Pt reports dizziness has resolved. C/o left arm numbness. Pt has difficulty with memory. Denies dizziness, difficulty with speech or problems with balance and coordination.  Psych: Denies anxiety, depression, SI/HI.  No other specific complaints in a complete review of systems (except as listed in HPI above).  Objective:   Physical Exam   BP 140/90   Pulse 71  Wt Readings from Last 3 Encounters:  08/06/18 177 lb 9.6 oz (80.6 kg)  06/05/18 182 lb 8 oz (82.8 kg)  02/12/18 180 lb 6.4 oz (81.8 kg)    General: Appears his stated age, well developed, well nourished in NAD. Skin: Warm, dry and intact. Ecchymosis noted around left eye. HEENT: Head: normal shape and size; Eyes: sclera white, no icterus, conjunctiva pink, PERRLA and EOMs intact;  Cardiovascular: Normal rate and rhythm. S1,S2 noted.  No murmur, rubs or gallops noted. No JVD or BLE edema. No carotid bruits noted. Pulmonary/Chest: Normal effort and positive vesicular breath sounds. No respiratory distress. No wheezes, rales or ronchi noted.  Musculoskeletal: Strength 4/5 LUE, 5/5 RUE. Gait slow and steady without device. Neurological: He engages with simple answers. Follows commands.   BMET    Component Value Date/Time   NA 136 08/24/2015 0617   K 4.5 08/22/2015 0326   CL 104 08/22/2015 0326   CO2 25 08/22/2015 0326   GLUCOSE 98 08/22/2015 0326   BUN 20 08/22/2015 0326   CREATININE 1.10 08/25/2015 0450   CALCIUM 7.7 (L) 08/22/2015 0326   GFRNONAA 57 (L) 08/25/2015 0450   GFRAA >60 08/25/2015 0450    Lipid Panel  No results found for: CHOL, TRIG, HDL, CHOLHDL, VLDL, LDLCALC  CBC    Component Value Date/Time   WBC 11.2 (H) 08/24/2015 0617   RBC 3.74 (L) 08/24/2015 0617   HGB 11.2 (L) 08/24/2015 0617   HCT 33.4 (L) 08/24/2015 0617   PLT 231 08/24/2015  0617   MCV 89.4 08/24/2015 0617   MCH 29.9 08/24/2015 0617   MCHC 33.4 08/24/2015 0617   RDW 14.2 08/24/2015 0617   LYMPHSABS 1.1 08/23/2015 0521   MONOABS 0.9 08/23/2015 0521   EOSABS 0.5 08/23/2015 0521   BASOSABS 0.0 08/23/2015 0521    Hgb A1C No results found for: HGBA1C         Assessment & Plan:   Dizziness, Left Arm  Numbness and Weakness:  TIA vs pinched nerve in arm Continue ASA 81 mg daily PT eval for LUE weakness Clinically improved, will continue to monitor D/w daughter, questions answered  Will reassess as needed Webb Silversmith, NP

## 2018-10-04 NOTE — Patient Instructions (Signed)
Paresthesia Paresthesia is a burning or prickling feeling. This feeling can happen in any part of the body. It often happens in the hands, arms, legs, or feet. Usually, it is not painful. In most cases, the feeling goes away in a short time and is not a sign of a serious problem. If you have paresthesia that lasts a long time, you may need to be seen by your doctor. Follow these instructions at home: Alcohol use   Do not drink alcohol if: ? Your doctor tells you not to drink. ? You are pregnant, may be pregnant, or are planning to become pregnant.  If you drink alcohol: ? Limit how much you use to:  0-1 drink a day for women.  0-2 drinks a day for men. ? Be aware of how much alcohol is in your drink. In the [place] one drink equals one 12 oz bottle of beer (355 mL), one 5 oz glass of wine (148 mL), or one 1 oz glass of hard liquor (44 mL). Nutrition   Eat a healthy diet. This includes: ? Eating foods that have a lot of fiber in them, such as fresh fruits and vegetables, whole grains, and beans. ? Limiting foods that have a lot of fat and processed sugars in them, such as fried or sweet foods. General instructions  Take over-the-counter and prescription medicines only as told by your doctor.  Do not use any products that have nicotine or tobacco in them, such as cigarettes and e-cigarettes. If you need help quitting, ask your doctor.  If you have diabetes, work with your doctor to make sure your blood sugar stays in a healthy range.  If your feet feel numb: ? Check for redness, warmth, and swelling every day. ? Wear padded socks and comfortable shoes. These help protect your feet.  Keep all follow-up visits as told by your doctor. This is important. Contact a doctor if:  You have paresthesia that gets worse or does not go away.  Your burning or prickling feeling gets worse when you walk.  You have pain or cramps.  You feel dizzy.  You have a rash. Get help right away if  you:  Feel weak.  Have trouble walking or moving.  Have problems speaking, understanding, or seeing.  Feel confused.  Cannot control when you pee (urinate) or poop (have a bowel movement).  Lose feeling (have numbness) after an injury.  Have new weakness in an arm or leg.  Pass out (faint). Summary  Paresthesia is a burning or prickling feeling. It often happens in the hands, arms, legs, or feet.  In most cases, the feeling goes away in a short time and is not a sign of a serious problem.  If you have paresthesia that lasts a long time, you may need to be seen by your doctor. This information is not intended to replace advice given to you by your health care provider. Make sure you discuss any questions you have with your health care provider. Document Released: 02/22/2008 Document Revised: 04/06/2018 Document Reviewed: 03/20/2017 Elsevier Patient Education  2020 [employer].

## 2019-01-21 DIAGNOSIS — Z03818 Encounter for observation for suspected exposure to other biological agents ruled out: Secondary | ICD-10-CM | POA: Diagnosis not present

## 2019-01-27 DIAGNOSIS — Z03818 Encounter for observation for suspected exposure to other biological agents ruled out: Secondary | ICD-10-CM | POA: Diagnosis not present

## 2019-02-11 ENCOUNTER — Ambulatory Visit: Payer: Medicare Other | Admitting: Internal Medicine

## 2019-02-11 ENCOUNTER — Other Ambulatory Visit: Payer: Self-pay

## 2019-02-11 ENCOUNTER — Encounter: Payer: Self-pay | Admitting: Internal Medicine

## 2019-02-11 VITALS — BP 125/75 | HR 70 | Temp 98.1°F | Resp 18 | Wt 168.6 lb

## 2019-02-11 DIAGNOSIS — I10 Essential (primary) hypertension: Secondary | ICD-10-CM | POA: Diagnosis not present

## 2019-02-11 DIAGNOSIS — N401 Enlarged prostate with lower urinary tract symptoms: Secondary | ICD-10-CM

## 2019-02-11 DIAGNOSIS — F0280 Dementia in other diseases classified elsewhere without behavioral disturbance: Secondary | ICD-10-CM | POA: Diagnosis not present

## 2019-02-11 DIAGNOSIS — Z8673 Personal history of transient ischemic attack (TIA), and cerebral infarction without residual deficits: Secondary | ICD-10-CM

## 2019-02-11 DIAGNOSIS — N183 Chronic kidney disease, stage 3 unspecified: Secondary | ICD-10-CM | POA: Diagnosis not present

## 2019-02-11 DIAGNOSIS — G301 Alzheimer's disease with late onset: Secondary | ICD-10-CM

## 2019-02-11 NOTE — Assessment & Plan Note (Signed)
No new stroke apparent with left arm transient weakness Will continue the aspirin

## 2019-02-11 NOTE — Assessment & Plan Note (Signed)
Will recheck labs--not done in past year

## 2019-02-11 NOTE — Assessment & Plan Note (Signed)
BP Readings from Last 3 Encounters:  02/11/19 125/75  10/04/18 140/90  08/06/18 132/68   Good control on amlodipine

## 2019-02-11 NOTE — Assessment & Plan Note (Signed)
Mild and no functional decline Seems to be fine without the [employer] with his wife so that helps On memantine

## 2019-02-11 NOTE — Assessment & Plan Note (Signed)
Voids okay on the tamsulosin

## 2019-02-11 NOTE — Progress Notes (Signed)
Subjective:    Patient ID: Jacob Wade, male    DOB: [birth year], 85 y.o.   MRN: [id number]  HPI Visit in assisted living apartment for review of chronic health conditions Reviewed status with Jacob Pucker RN--no new concerns Wife is here also--as usual  He feels okay Memory about the same No functional decline--doesn't need aide for ADLs Walks without assistive device  Has adjusted to no day program No depression or anxiety  No chest pain  No SOB No dizziness No sig edema  Voids okay He feels his stream is okay  No focal weakness Doesn't remember the left arm spell of weakness  Current Outpatient Medications on File Prior to Visit  Medication Sig Dispense Refill  . amLODipine (NORVASC) 5 MG tablet Take 1 tablet (5 mg total) by mouth daily. 30 tablet 11  . Ascorbic Acid (VITAMIN C) 1000 MG tablet Take 1,000 mg by mouth daily.    Marland Kitchen aspirin EC 81 MG tablet Take 81 mg by mouth daily.    . Cholecalciferol (VITAMIN D3) 50 MCG (2000 UT) TABS Take 1 tablet by mouth daily.    . memantine (NAMENDA) 5 MG tablet Take 5 mg by mouth 2 (two) times daily.    . tamsulosin (FLOMAX) 0.4 MG CAPS capsule Take 1 capsule (0.4 mg total) by mouth daily. 30 capsule 0   No current facility-administered medications on file prior to visit.     Allergies  Allergen Reactions  . Cephalosporins Other (See Comments)    Reaction:  Tremors   . Ciprofloxacin Other (See Comments)    Reaction:  Unknown     Past Medical History:  Diagnosis Date  . Alzheimer's dementia ([place])   . Chronic kidney disease   . Chronic kidney disease, stage III (moderate)   . Hypertension   . Skin cancer   . Stroke (cerebrum) [facility])     Past Surgical History:  Procedure Laterality Date  . head hematoma surgery     from fall  . MOHS SURGERY      Family History  Problem Relation Age of Onset  . CAD Father   . Lung cancer Brother   . Kidney disease Neg Hx   . Prostate cancer Neg Hx     Social History    Socioeconomic History  . Marital status: Married    Spouse name: Not on file  . Number of children: 22  . Years of education: Not on file  . Highest education level: Not on file  Occupational History  . Occupation: Academic librarian    Comment: [employer]  Social Needs  . Financial resource strain: Not on file  . Food insecurity    Worry: Not on file    Inability: Not on file  . Transportation needs    Medical: Not on file    Non-medical: Not on file  Tobacco Use  . Smoking status: Former Research scientist (life sciences)  . Smokeless tobacco: Never Used  . Tobacco comment: quit 1951  Substance and Sexual Activity  . Alcohol use: Yes    Alcohol/week: 1.0 standard drinks    Types: 1 Shots of liquor per week    Comment: Bourbon before dinner  . Drug use: No  . Sexual activity: Not on file  Lifestyle  . Physical activity    Days per week: Not on file    Minutes per session: Not on file  . Stress: Not on file  Relationships  . Social Herbalist on phone: Not  on file    Gets together: Not on file    Attends religious service: Not on file    Active member of club or organization: Not on file    Attends meetings of clubs or organizations: Not on file    Relationship status: Not on file  . Intimate partner violence    Fear of current or ex partner: Not on file    Emotionally abused: Not on file    Physically abused: Not on file    Forced sexual activity: Not on file  Other Topics Concern  . Not on file  Social History Narrative   1 son, 1 daughter      Has living will    Daughter has health care POA   Has DNR --confirmed and written 08/08/16   No tube feeds   Hemlock Society   Review of Systems No sig joint pain Sleeps okay Appetite is okay  Weight down some---lost some of the weight he gained in first coming here Bowels move well    Objective:   Physical Exam  Constitutional: He appears well-developed. No distress.  Neck: No thyromegaly present.  Cardiovascular: Normal  rate, regular rhythm and normal heart sounds. Exam reveals no gallop.  No murmur heard. Respiratory: Effort normal and breath sounds normal. No respiratory distress. He has no wheezes. He has no rales.  GI: Soft. There is no abdominal tenderness.  Musculoskeletal:        General: No tenderness or edema.  Lymphadenopathy:    He has no cervical adenopathy.  Skin: No rash noted. No erythema.  Psychiatric: He has a normal mood and affect. His behavior is normal.           Assessment & Plan:

## 2019-04-21 ENCOUNTER — Encounter: Payer: Self-pay | Admitting: Internal Medicine

## 2019-04-21 ENCOUNTER — Other Ambulatory Visit: Payer: Self-pay

## 2019-04-21 ENCOUNTER — Ambulatory Visit: Payer: Medicare Other | Admitting: Internal Medicine

## 2019-04-21 DIAGNOSIS — I10 Essential (primary) hypertension: Secondary | ICD-10-CM | POA: Diagnosis not present

## 2019-04-21 DIAGNOSIS — Z8673 Personal history of transient ischemic attack (TIA), and cerebral infarction without residual deficits: Secondary | ICD-10-CM

## 2019-04-21 DIAGNOSIS — N401 Enlarged prostate with lower urinary tract symptoms: Secondary | ICD-10-CM | POA: Diagnosis not present

## 2019-04-21 DIAGNOSIS — G301 Alzheimer's disease with late onset: Secondary | ICD-10-CM

## 2019-04-21 DIAGNOSIS — N183 Chronic kidney disease, stage 3 unspecified: Secondary | ICD-10-CM

## 2019-04-21 DIAGNOSIS — F0280 Dementia in other diseases classified elsewhere without behavioral disturbance: Secondary | ICD-10-CM

## 2019-04-21 NOTE — Assessment & Plan Note (Signed)
Continue ASA

## 2019-04-21 NOTE — Progress Notes (Signed)
Subjective:    Patient ID: Jacob Wade, male    DOB: 11-Mar-1924, 86 y.o.   MRN: [id number]  HPI  Routine follow up for resident in [address] RN reports large lesion to left chest wall. No drainage. Resident denies concerns. He is sleeping well. He walks without device. He needs assistance with ADL's. His appetite is good. Weight up 5 lbs. Incontinent of urine at times. Bowels okay. Denies pain, reflux or SOB.  Dementia: Stable cognitve and functional needs. He is taking Namenda as prescribed.   HTN: Controlled on Amlodipine.  BPH: He voids fine on Flomax.  CKD 3: GFR 51. No ACEI/ARB.  Hx of Stroke: Mostly cognitive, some intermittent left arm weakness. On ASA.  Review of Systems      Past Medical History:  Diagnosis Date  . Alzheimer's dementia ([place])   . Chronic kidney disease   . Chronic kidney disease, stage III (moderate)   . Hypertension   . Skin cancer   . Stroke (cerebrum) [facility])     Current Outpatient Medications  Medication Sig Dispense Refill  . amLODipine (NORVASC) 5 MG tablet Take 1 tablet (5 mg total) by mouth daily. 30 tablet 11  . Ascorbic Acid (VITAMIN C) 1000 MG tablet Take 1,000 mg by mouth daily.    Marland Kitchen aspirin EC 81 MG tablet Take 81 mg by mouth daily.    . Cholecalciferol (VITAMIN D3) 50 MCG (2000 UT) TABS Take 1 tablet by mouth daily.    . memantine (NAMENDA) 5 MG tablet Take 5 mg by mouth 2 (two) times daily.    . tamsulosin (FLOMAX) 0.4 MG CAPS capsule Take 1 capsule (0.4 mg total) by mouth daily. 30 capsule 0   No current facility-administered medications for this visit.    Allergies  Allergen Reactions  . Cephalosporins Other (See Comments)    Reaction:  Tremors   . Ciprofloxacin Other (See Comments)    Reaction:  Unknown     Family History  Problem Relation Age of Onset  . CAD Father   . Lung cancer Brother   . Kidney disease Neg Hx   . Prostate cancer Neg Hx     Social History   Socioeconomic History  . Marital status:  Married    Spouse name: Not on file  . Number of children: 22  . Years of education: Not on file  . Highest education level: Not on file  Occupational History  . Occupation: Academic librarian    Comment: [employer]  Tobacco Use  . Smoking status: Former Research scientist (life sciences)  . Smokeless tobacco: Never Used  . Tobacco comment: quit 1951  Substance and Sexual Activity  . Alcohol use: Yes    Alcohol/week: 1.0 standard drinks    Types: 1 Shots of liquor per week    Comment: Bourbon before dinner  . Drug use: No  . Sexual activity: Not on file  Other Topics Concern  . Not on file  Social History Narrative   1 son, 1 daughter      Has living will    Daughter has health care POA   Has DNR --confirmed and written 08/08/16   No tube feeds   Hemlock Society   Social Determinants of Health   Financial Resource Strain:   . Difficulty of Paying Living Expenses: Not on file  Food Insecurity:   . Worried About Charity fundraiser in the Last Year: Not on file  . Ran Out of Food in the Last  Year: Not on file  Transportation Needs:   . Lack of Transportation (Medical): Not on file  . Lack of Transportation (Non-Medical): Not on file  Physical Activity:   . Days of Exercise per Week: Not on file  . Minutes of Exercise per Session: Not on file  Stress:   . Feeling of Stress : Not on file  Social Connections:   . Frequency of Communication with Friends and Family: Not on file  . Frequency of Social Gatherings with Friends and Family: Not on file  . Attends Religious Services: Not on file  . Active Member of Clubs or Organizations: Not on file  . Attends Archivist Meetings: Not on file  . Marital Status: Not on file  Intimate Partner Violence:   . Fear of Current or Ex-Partner: Not on file  . Emotionally Abused: Not on file  . Physically Abused: Not on file  . Sexually Abused: Not on file     Constitutional: Denies fever, malaise, fatigue, headache or abrupt weight changes.    HEENT: Denies eye pain, eye redness, ear pain, ringing in the ears, wax buildup, runny nose, nasal congestion, bloody nose, or sore throat. Respiratory: Denies difficulty breathing, shortness of breath, cough or sputum production.   Cardiovascular: Denies chest pain, chest tightness, palpitations or swelling in the hands or feet.  Gastrointestinal: Denies abdominal pain, bloating, constipation, diarrhea or blood in the stool.  GU: Denies urgency, frequency, pain with urination, burning sensation, blood in urine, odor or discharge. Musculoskeletal: Denies decrease in range of motion, difficulty with gait, muscle pain or joint pain and swelling.  Skin: Pt reports skin lesion of chest wall. Denies redness, rashes, or ulcercations.  Neurological: Pt has memory loss. Denies dizziness, difficulty with speech or problems with balance and coordination.  Psych: Denies anxiety, depression, SI/HI.  No other specific complaints in a complete review of systems (except as listed in HPI above).  Objective:   Physical Exam  BP 135/86   Pulse 65   Temp 98.1 F (36.7 C)   Resp 18   Wt 173 lb 12.8 oz (78.8 kg)   BMI 24.94 kg/m  Wt Readings from Last 3 Encounters:  04/21/19 173 lb 12.8 oz (78.8 kg)  02/11/19 168 lb 9.6 oz (76.5 kg)  08/06/18 177 lb 9.6 oz (80.6 kg)    General: Appears his stated age, well developed, well nourished in NAD. Skin: Pedunculated lesion noted of left anterior chest wall, concerning for skin cancer. HEENT: Head: normal shape and size; Eyes: sclera white, no icterus, conjunctiva pink;  Neck:  Neck supple, trachea midline. No masses, lumps or thyromegaly present.  Cardiovascular: Normal rate and rhythm. S1,S2 noted.  No murmur, rubs or gallops noted. No JVD or BLE edema. N Pulmonary/Chest: Normal effort and positive vesicular breath sounds. No respiratory distress. No wheezes, rales or ronchi noted.  Abdomen: Soft and nontender. Normal bowel sounds. No distention or masses  noted.  Musculoskeletal: Gait slow and steady without device. Neurological: Alert and confused. Psychiatric: Mood and affect normal. Behavior is normal. Judgment and thought content normal.    BMET    Component Value Date/Time   NA 136 08/24/2015 0617   K 4.5 08/22/2015 0326   CL 104 08/22/2015 0326   CO2 25 08/22/2015 0326   GLUCOSE 98 08/22/2015 0326   BUN 20 08/22/2015 0326   CREATININE 1.10 08/25/2015 0450   CALCIUM 7.7 (L) 08/22/2015 0326   GFRNONAA 57 (L) 08/25/2015 0450   GFRAA >60 08/25/2015  0450    Lipid Panel  No results found for: CHOL, TRIG, HDL, CHOLHDL, VLDL, LDLCALC  CBC    Component Value Date/Time   WBC 11.2 (H) 08/24/2015 0617   RBC 3.74 (L) 08/24/2015 0617   HGB 11.2 (L) 08/24/2015 0617   HCT 33.4 (L) 08/24/2015 0617   PLT 231 08/24/2015 0617   MCV 89.4 08/24/2015 0617   MCH 29.9 08/24/2015 0617   MCHC 33.4 08/24/2015 0617   RDW 14.2 08/24/2015 0617   LYMPHSABS 1.1 08/23/2015 0521   MONOABS 0.9 08/23/2015 0521   EOSABS 0.5 08/23/2015 0521   BASOSABS 0.0 08/23/2015 0521    Hgb A1C No results found for: HGBA1C          Assessment & Plan:   Skin Lesion of Chest Wall:  Needs to see dermatology for excision and treatment  Will reassess as needed Webb Silversmith, NP

## 2019-04-21 NOTE — Assessment & Plan Note (Signed)
Stable Continue Namenda Appreciate SNF care

## 2019-04-21 NOTE — Assessment & Plan Note (Signed)
Controlled on Amlodipine Will monitor 

## 2019-04-21 NOTE — Assessment & Plan Note (Signed)
Yearly CMET No ACEI/ARB

## 2019-04-21 NOTE — Patient Instructions (Signed)
Basal Cell Carcinoma Basal cell carcinoma is the most common form of skin cancer. It begins in the basal cells, which are at the bottom of the outer skin layer (epidermis). Basal cell carcinoma can almost always be cured. It rarely spreads to other areas of the body (metastasizes). It may come back at the same location (recur), but it can be treated again if this happens. Basal cell carcinoma occurs most often on parts of the body that are frequently exposed to the sun, such as:  Parts of the head, including the scalp or face.  Ears.  Neck.  Arms or legs.  Backs of the hands. What are the causes? This condition is usually caused by exposure to ultraviolet (UV) light. UV light may come from the sun or from tanning beds. Other causes include:  Exposure to a highly poisonous metal (arsenic).  Exposure to high-energy X-rays (radiation).  Exposure to toxic tars and oils.  Certain genetic conditions, such as a condition that makes a person sensitive to sunlight (xeroderma pigmentosum). What increases the risk? You are more likely to develop this condition if:  You are older than 84 years of age.  You have: ? Fair skin (light complexion). ? Blond or red hair. ? Blue, green, or gray eyes. ? Childhood freckling. ? Had sun exposure over long periods of time, especially during childhood. ? Had repeated sunburns. ? A weakened immune system. ? Been exposed to certain chemicals, such as tar, soot, and arsenic. ? Chronic inflammatory conditions. ? Chronic infections.  You use tanning beds. What are the signs or symptoms? The main symptom of this condition is a growth or lesion on the skin.  The shape and color of the growth or lesion may vary. The main types include: ? An open sore that may remain open for 3 weeks or longer. The sore may bleed or crust. This type of lesion can be an early sign of basal cell carcinoma. Basal cell carcinoma often shows up as a sore that does not  heal. ? A reddish area that may crust, itch, or cause discomfort. This may occur on areas that are exposed to the sun. These patches might be easier to feel than to see. ? A shiny or clear bump that is red, white, or pink. In people who have dark hair, the bump is often tan, black, or brown. These bumps can look like moles. ? A pink growth with a raised border. The growth will have a crusted and indented area in the center. Small blood vessels may appear on the surface of the growth as it gets bigger. ? A scar-like area that looks like shiny, stretched skin. The area may be white, yellow, or waxy. It often has irregular borders. This may be a sign of more aggressive basal cell carcinoma. How is this diagnosed? This condition may be diagnosed with:  A physical exam.  Removal of a tissue sample to be examined under a microscope (biopsy). How is this treated? Treatment for this condition involves removing the cancerous tissue. The method that is used for this depends on the type, size, location, and number of tumors. Possible treatments include:  Mohs surgery. In this procedure, the cancerous skin cells are removed layer by layer until all of the tumor has been removed.  Surgical removal (excision) of the tumor. This involves removing the entire tumor and a small amount of normal skin that surrounds it.  Cryosurgery. This involves freezing the tumor with liquid nitrogen.  Plastic surgery.   The tumor is removed, and healthy skin from another part of the body is used to cover the wound. This may be done for large tumors that are in areas where it is not possible to stretch the nearby skin to sew the edges of the wound together.  Radiation. This may be used for tumors on the face.  Photodynamic therapy. A chemical cream is applied to the skin, and light exposure is used to activate the chemical.  Electrodesiccation and curettage. This involves alternately scraping and burning the tumor while using  an electric current to control bleeding.  Chemical treatments, such as imiquimod cream and interferon injections. These may be used to remove superficial tumors with minimal scarring. Follow these instructions at home:  Avoid direct exposure to the sun.  Do self-exams as told by your health care provider. Look for new spots or changes in your skin.  Keep all follow-up visits as told by your health care provider. This is important. How is this prevented?   Avoid the sun when it is the strongest. This is usually between 10 a.m. and 4 p.m.  When you are out in the sun, use a sunscreen that has a sun protection factor (SPF) of at least 30.  Apply sunscreen at least 30 minutes before exposure to the sun.  Reapply sunscreen every 2-4 hours while you are outside. Also reapply it after swimming and after excessive sweating.  Always wear hats, protective clothing, and UV-blocking sunglasses when you are outdoors.  Do not use tanning beds. Contact a health care provider if you:  Notice any new spots or any changes in your skin.  Have had a basal cell carcinoma tumor removed, and you notice a new growth in the same location. Get help right away if you have a spot that:  Is sore and does not heal.  Bleeds easily with minor injury. Summary  Basal cell carcinoma is the most common form of skin cancer. It begins in the bottom of the outer skin layer (epidermis). Basal cell carcinoma can almost always be cured.  This condition is usually caused by exposure to ultraviolet (UV) light. It mostly affects the face, scalp, neck, ears, arms, legs, or backs of the hands.  The main symptom of this condition is a growth or lesion on the skin that can vary in shape and color.  You can prevent this cancer by avoiding direct exposure to the sun, applying sunscreen of at least 30 SPF, and wearing protective clothing.  Apply sunscreen 30 minutes before you go out into the sun, and reapply every 2-4  hours while you are outside. This information is not intended to replace advice given to you by your health care provider. Make sure you discuss any questions you have with your health care provider. Document Revised: 07/30/2017 Document Reviewed: 07/30/2017 Elsevier Patient Education  2020 Elsevier Inc[employer]  

## 2019-04-21 NOTE — Assessment & Plan Note (Signed)
-   Continue Flomax 

## 2019-07-15 ENCOUNTER — Ambulatory Visit: Payer: Medicare Other | Admitting: Internal Medicine

## 2019-07-15 ENCOUNTER — Encounter: Payer: Self-pay | Admitting: Internal Medicine

## 2019-07-15 ENCOUNTER — Other Ambulatory Visit: Payer: Self-pay

## 2019-07-15 DIAGNOSIS — N1831 Chronic kidney disease, stage 3a: Secondary | ICD-10-CM | POA: Diagnosis not present

## 2019-07-15 DIAGNOSIS — I10 Essential (primary) hypertension: Secondary | ICD-10-CM

## 2019-07-15 DIAGNOSIS — N400 Enlarged prostate without lower urinary tract symptoms: Secondary | ICD-10-CM

## 2019-07-15 DIAGNOSIS — G301 Alzheimer's disease with late onset: Secondary | ICD-10-CM

## 2019-07-15 DIAGNOSIS — F0280 Dementia in other diseases classified elsewhere without behavioral disturbance: Secondary | ICD-10-CM

## 2019-07-15 NOTE — Assessment & Plan Note (Signed)
Mild Continues to do well with supervision in AL apartment No Rx needed

## 2019-07-15 NOTE — Assessment & Plan Note (Signed)
Mild and stable If BP stays up, would start losartan 25

## 2019-07-15 NOTE — Assessment & Plan Note (Signed)
Doing okay on the tamsulosin 

## 2019-07-15 NOTE — Assessment & Plan Note (Signed)
BP Readings from Last 3 Encounters:  07/15/19 (!) 170/62  04/21/19 135/86  02/11/19 125/75   Usually okay but up some now Will monitor more closely and add second med if stays up

## 2019-07-15 NOTE — Progress Notes (Signed)
Subjective:    Patient ID: Jacob Wade, male    DOB: Sep 25, 1923, 86 y.o.   MRN: [id number]  HPI Visit in assisted living apartment for follow up of chronic health conditions Reviewed status with Luellen Pucker RN Wife is here  No new concerns from staff  No chest pain  No SOB No dizziness or syncope No edema  Voids okay Stream reasonable Variable nocturia--not a problem  Walks without assist Independent with ADLs Generally continent  Current Outpatient Medications on File Prior to Visit  Medication Sig Dispense Refill  . amLODipine (NORVASC) 5 MG tablet Take 1 tablet (5 mg total) by mouth daily. 30 tablet 11  . Ascorbic Acid (VITAMIN C) 1000 MG tablet Take 1,000 mg by mouth daily.    Marland Kitchen aspirin EC 81 MG tablet Take 81 mg by mouth daily.    . Cholecalciferol (VITAMIN D3) 50 MCG (2000 UT) TABS Take 1 tablet by mouth daily.    . memantine (NAMENDA) 5 MG tablet Take 5 mg by mouth 2 (two) times daily.    . tamsulosin (FLOMAX) 0.4 MG CAPS capsule Take 1 capsule (0.4 mg total) by mouth daily. 30 capsule 0   No current facility-administered medications on file prior to visit.    Allergies  Allergen Reactions  . Cephalosporins Other (See Comments)    Reaction:  Tremors   . Ciprofloxacin Other (See Comments)    Reaction:  Unknown     Past Medical History:  Diagnosis Date  . Alzheimer's dementia ([place])   . Chronic kidney disease   . Chronic kidney disease, stage III (moderate)   . Hypertension   . Skin cancer   . Stroke (cerebrum) [facility])     Past Surgical History:  Procedure Laterality Date  . head hematoma surgery     from fall  . MOHS SURGERY      Family History  Problem Relation Age of Onset  . CAD Father   . Lung cancer Brother   . Kidney disease Neg Hx   . Prostate cancer Neg Hx     Social History   Socioeconomic History  . Marital status: Married    Spouse name: Not on file  . Number of children: 22  . Years of education: Not on file  . Highest  education level: Not on file  Occupational History  . Occupation: Academic librarian    Comment: [employer]  Tobacco Use  . Smoking status: Former Research scientist (life sciences)  . Smokeless tobacco: Never Used  . Tobacco comment: quit 1951  Substance and Sexual Activity  . Alcohol use: Yes    Alcohol/week: 1.0 standard drinks    Types: 1 Shots of liquor per week    Comment: Bourbon before dinner  . Drug use: No  . Sexual activity: Not on file  Other Topics Concern  . Not on file  Social History Narrative   1 son, 1 daughter      Has living will    Daughter has health care POA   Has DNR --confirmed and written 08/08/16   No tube feeds   Hemlock Society   Social Determinants of Health   Financial Resource Strain:   . Difficulty of Paying Living Expenses:   Food Insecurity:   . Worried About Charity fundraiser in the Last Year:   . Arboriculturist in the Last Year:   Transportation Needs:   . Film/video editor (Medical):   Marland Kitchen Lack of Transportation (Non-Medical):   Physical  Activity:   . Days of Exercise per Week:   . Minutes of Exercise per Session:   Stress:   . Feeling of Stress :   Social Connections:   . Frequency of Communication with Friends and Family:   . Frequency of Social Gatherings with Friends and Family:   . Attends Religious Services:   . Active Member of Clubs or Organizations:   . Attends Archivist Meetings:   Marland Kitchen Marital Status:   Intimate Partner Violence:   . Fear of Current or Ex-Partner:   . Emotionally Abused:   Marland Kitchen Physically Abused:   . Sexually Abused:    Review of Systems Sleeping well Appetite is good--weight is stable Bowels moving fine    Objective:   Physical Exam  Constitutional: He appears well-developed. No distress.  Neck: No thyromegaly present.  Cardiovascular: Normal rate, regular rhythm and normal heart sounds. Exam reveals no gallop.  No murmur heard. Respiratory: Effort normal and breath sounds normal. No respiratory  distress. He has no wheezes. He has no rales.  GI: Soft. There is no abdominal tenderness.  Musculoskeletal:        General: No tenderness or edema.  Lymphadenopathy:    He has no cervical adenopathy.  Neurological:  HOH but engages fine if he hears me okay  Psychiatric: He has a normal mood and affect. His behavior is normal.           Assessment & Plan:

## 2019-08-19 DIAGNOSIS — B079 Viral wart, unspecified: Secondary | ICD-10-CM | POA: Diagnosis not present

## 2019-08-19 DIAGNOSIS — D225 Melanocytic nevi of trunk: Secondary | ICD-10-CM | POA: Diagnosis not present

## 2019-08-19 DIAGNOSIS — D2262 Melanocytic nevi of left upper limb, including shoulder: Secondary | ICD-10-CM | POA: Diagnosis not present

## 2019-08-19 DIAGNOSIS — L821 Other seborrheic keratosis: Secondary | ICD-10-CM | POA: Diagnosis not present

## 2019-08-19 DIAGNOSIS — D2261 Melanocytic nevi of right upper limb, including shoulder: Secondary | ICD-10-CM | POA: Diagnosis not present

## 2019-10-13 DIAGNOSIS — D485 Neoplasm of uncertain behavior of skin: Secondary | ICD-10-CM | POA: Diagnosis not present

## 2019-10-13 DIAGNOSIS — C44729 Squamous cell carcinoma of skin of left lower limb, including hip: Secondary | ICD-10-CM | POA: Diagnosis not present

## 2019-10-13 DIAGNOSIS — R208 Other disturbances of skin sensation: Secondary | ICD-10-CM | POA: Diagnosis not present

## 2019-11-17 DIAGNOSIS — C44729 Squamous cell carcinoma of skin of left lower limb, including hip: Secondary | ICD-10-CM | POA: Diagnosis not present

## 2019-12-10 ENCOUNTER — Other Ambulatory Visit: Payer: Self-pay

## 2019-12-10 ENCOUNTER — Ambulatory Visit: Payer: Medicare Other | Admitting: Internal Medicine

## 2019-12-10 DIAGNOSIS — I10 Essential (primary) hypertension: Secondary | ICD-10-CM | POA: Diagnosis not present

## 2019-12-10 DIAGNOSIS — G301 Alzheimer's disease with late onset: Secondary | ICD-10-CM | POA: Diagnosis not present

## 2019-12-10 DIAGNOSIS — N400 Enlarged prostate without lower urinary tract symptoms: Secondary | ICD-10-CM

## 2019-12-10 DIAGNOSIS — F0280 Dementia in other diseases classified elsewhere without behavioral disturbance: Secondary | ICD-10-CM

## 2019-12-10 DIAGNOSIS — N1831 Chronic kidney disease, stage 3a: Secondary | ICD-10-CM

## 2019-12-10 DIAGNOSIS — Z8673 Personal history of transient ischemic attack (TIA), and cerebral infarction without residual deficits: Secondary | ICD-10-CM

## 2019-12-13 ENCOUNTER — Encounter: Payer: Self-pay | Admitting: Internal Medicine

## 2019-12-13 NOTE — Assessment & Plan Note (Signed)
Monitor CMET yearly

## 2019-12-13 NOTE — Assessment & Plan Note (Signed)
Continue amlodipine We will monitor

## 2019-12-13 NOTE — Progress Notes (Signed)
Subjective:    Patient ID: Jacob Wade, male    DOB: [birth year], 86 y.o.   MRN: [id number]  HPI  Resident seen in a PT 64 for routine follow-up Reviewed with RN, no new concerns at this time.  Resident has not been going to the [facility] secondary to Covid. He sleeps well.  Persistent confusion but no agitation or combativeness.  He is independent with ADLs.  He walks without a device.  His appetite is good however he is down 10 pounds in the last 5 months.  He denies urinary or bowel incontinence.  He denies pain, reflux or shortness of breath.  Alzheimer's: Stable on Namenda.  His wife would like him to be able to attend the [facility] when he can go back.  HTN: His BP today is 126/68.  He is taking Amlodipine as prescribed.  BPH: He denies any issues on Flomax.  CKD 3: His last creatinine was 1.22, GFR 51, 01/2019.  He is not currently on an ACE or ARB.  History of Stroke: Mainly cognitive.  He is taking Aspirin as prescribed.  Review of Systems      Past Medical History:  Diagnosis Date  . Alzheimer's dementia ([place])   . Chronic kidney disease   . Chronic kidney disease, stage III (moderate)   . Hypertension   . Skin cancer   . Stroke (cerebrum) [facility])     Current Outpatient Medications  Medication Sig Dispense Refill  . amLODipine (NORVASC) 5 MG tablet Take 1 tablet (5 mg total) by mouth daily. 30 tablet 11  . Ascorbic Acid (VITAMIN C) 1000 MG tablet Take 1,000 mg by mouth daily.    Marland Kitchen aspirin EC 81 MG tablet Take 81 mg by mouth daily.    . Cholecalciferol (VITAMIN D3) 50 MCG (2000 UT) TABS Take 1 tablet by mouth daily.    . memantine (NAMENDA) 5 MG tablet Take 5 mg by mouth 2 (two) times daily.    . tamsulosin (FLOMAX) 0.4 MG CAPS capsule Take 1 capsule (0.4 mg total) by mouth daily. 30 capsule 0   No current facility-administered medications for this visit.    Allergies  Allergen Reactions  . Cephalosporins Other (See Comments)    Reaction:  Tremors   .  Ciprofloxacin Other (See Comments)    Reaction:  Unknown     Family History  Problem Relation Age of Onset  . CAD Father   . Lung cancer Brother   . Kidney disease Neg Hx   . Prostate cancer Neg Hx     Social History   Socioeconomic History  . Marital status: Married    Spouse name: Not on file  . Number of children: 22  . Years of education: Not on file  . Highest education level: Not on file  Occupational History  . Occupation: Academic librarian    Comment: [employer]  Tobacco Use  . Smoking status: Former Research scientist (life sciences)  . Smokeless tobacco: Never Used  . Tobacco comment: quit 1951  Substance and Sexual Activity  . Alcohol use: Yes    Alcohol/week: 1.0 standard drink    Types: 1 Shots of liquor per week    Comment: Bourbon before dinner  . Drug use: No  . Sexual activity: Not on file  Other Topics Concern  . Not on file  Social History Narrative   1 son, 1 daughter      Has living will    Daughter has health care POA   Has  DNR --confirmed and written 08/08/16   No tube feeds   [place] Society   Social Determinants of Health   Financial Resource Strain:   . Difficulty of Paying Living Expenses: Not on file  Food Insecurity:   . Worried About Charity fundraiser in the Last Year: Not on file  . Ran Out of Food in the Last Year: Not on file  Transportation Needs:   . Lack of Transportation (Medical): Not on file  . Lack of Transportation (Non-Medical): Not on file  Physical Activity:   . Days of Exercise per Week: Not on file  . Minutes of Exercise per Session: Not on file  Stress:   . Feeling of Stress : Not on file  Social Connections:   . Frequency of Communication with Friends and Family: Not on file  . Frequency of Social Gatherings with Friends and Family: Not on file  . Attends Religious Services: Not on file  . Active Member of Clubs or Organizations: Not on file  . Attends Archivist Meetings: Not on file  . Marital Status: Not on file    Intimate Partner Violence:   . Fear of Current or Ex-Partner: Not on file  . Emotionally Abused: Not on file  . Physically Abused: Not on file  . Sexually Abused: Not on file     Constitutional: Denies fever, malaise, fatigue, headache or abrupt weight changes.  HEENT: Denies eye pain, eye redness, ear pain, ringing in the ears, wax buildup, runny nose, nasal congestion, bloody nose, or sore throat. Respiratory: Denies difficulty breathing, shortness of breath, cough or sputum production.   Cardiovascular: Denies chest pain, chest tightness, palpitations or swelling in the hands or feet.  Gastrointestinal: Denies abdominal pain, bloating, constipation, diarrhea or blood in the stool.  GU: Denies urgency, frequency, pain with urination, burning sensation, blood in urine, odor or discharge. Musculoskeletal: Denies decrease in range of motion, difficulty with gait, muscle pain or joint pain and swelling.  Skin: Denies redness, rashes, lesions or ulcercations.  Neurological: Patient reports difficulty with memory.  Denies dizziness, difficulty with speech or problems with balance and coordination.  Psych: Denies anxiety, depression, SI/HI.  No other specific complaints in a complete review of systems (except as listed in HPI above).  Objective:   Physical Exam   BP 126/68   Pulse 70   Temp (!) 97.3 F (36.3 C)   Resp 20   Wt 162 lb 3.2 oz (73.6 kg)   SpO2 98%   BMI 23.27 kg/m  Wt Readings from Last 3 Encounters:  12/13/19 162 lb 3.2 oz (73.6 kg)  07/15/19 173 lb 9.6 oz (78.7 kg)  04/21/19 173 lb 12.8 oz (78.8 kg)    General: Appears his stated age, well developed, well nourished in NAD. Neck: No nodes or JVD. Cardiovascular: Normal rate and rhythm. S1,S2 noted.  No murmur, rubs or gallops noted. No JVD or BLE edema.  Pulmonary/Chest: Normal effort and positive vesicular breath sounds. No respiratory distress. No wheezes, rales or ronchi noted.  Abdomen: Soft and nontender.  Normal bowel sounds. No distention or masses noted.  Musculoskeletal: Gait slow and steady without device. Neurological: Alert to person.  Confused but pleasant.  BMET    Component Value Date/Time   NA 136 08/24/2015 0617   K 4.5 08/22/2015 0326   CL 104 08/22/2015 0326   CO2 25 08/22/2015 0326   GLUCOSE 98 08/22/2015 0326   BUN 20 08/22/2015 0326   CREATININE 1.10 08/25/2015  0450   CALCIUM 7.7 (L) 08/22/2015 0326   GFRNONAA 57 (L) 08/25/2015 0450   GFRAA >60 08/25/2015 0450    Lipid Panel  No results found for: CHOL, TRIG, HDL, CHOLHDL, VLDL, LDLCALC  CBC    Component Value Date/Time   WBC 11.2 (H) 08/24/2015 0617   RBC 3.74 (L) 08/24/2015 0617   HGB 11.2 (L) 08/24/2015 0617   HCT 33.4 (L) 08/24/2015 0617   PLT 231 08/24/2015 0617   MCV 89.4 08/24/2015 0617   MCH 29.9 08/24/2015 0617   MCHC 33.4 08/24/2015 0617   RDW 14.2 08/24/2015 0617   LYMPHSABS 1.1 08/23/2015 0521   MONOABS 0.9 08/23/2015 0521   EOSABS 0.5 08/23/2015 0521   BASOSABS 0.0 08/23/2015 0521    Hgb A1C No results found for: HGBA1C         Assessment & Plan:

## 2019-12-13 NOTE — Patient Instructions (Signed)

## 2019-12-13 NOTE — Assessment & Plan Note (Signed)
-   Continue Flomax 

## 2019-12-13 NOTE — Assessment & Plan Note (Signed)
Continue Namenda Appreciate ALF care

## 2019-12-13 NOTE — Assessment & Plan Note (Signed)
Continue ASA and [facility] ALF care

## 2019-12-21 ENCOUNTER — Ambulatory Visit: Payer: Medicare Other | Admitting: Cardiovascular Disease

## 2020-01-04 ENCOUNTER — Other Ambulatory Visit: Payer: Self-pay

## 2020-01-04 ENCOUNTER — Encounter: Payer: Self-pay | Admitting: Cardiovascular Disease

## 2020-01-04 ENCOUNTER — Ambulatory Visit (INDEPENDENT_AMBULATORY_CARE_PROVIDER_SITE_OTHER): Payer: Medicare Other | Admitting: Internal Medicine

## 2020-01-04 ENCOUNTER — Ambulatory Visit: Payer: Medicare Other | Admitting: Cardiovascular Disease

## 2020-01-04 VITALS — BP 150/90 | HR 40 | Ht 68.0 in | Wt 160.5 lb

## 2020-01-04 DIAGNOSIS — Z7189 Other specified counseling: Secondary | ICD-10-CM

## 2020-01-04 DIAGNOSIS — I442 Atrioventricular block, complete: Secondary | ICD-10-CM

## 2020-01-04 DIAGNOSIS — F0280 Dementia in other diseases classified elsewhere without behavioral disturbance: Secondary | ICD-10-CM

## 2020-01-04 DIAGNOSIS — G309 Alzheimer's disease, unspecified: Secondary | ICD-10-CM | POA: Diagnosis not present

## 2020-01-04 DIAGNOSIS — I4949 Other premature depolarization: Secondary | ICD-10-CM | POA: Diagnosis not present

## 2020-01-04 NOTE — Patient Instructions (Signed)
Medication Instructions:  No changes *If you need a refill on your cardiac medications before your next appointment, please call your pharmacy*   Lab Work: No changes If you have labs (blood work) drawn today and your tests are completely normal, you will receive your results only by: Marland Kitchen MyChart Message (if you have MyChart) OR . A paper copy in the mail If you have any lab test that is abnormal or we need to change your treatment, we will call you to review the results.   Testing/Procedures: None   Follow-Up: At [facility], you and your health needs are our priority.  As part of our continuing mission to provide you with exceptional heart care, we have created designated Provider Care Teams.  These Care Teams include your primary Cardiologist (physician) and Advanced Practice Providers (APPs -  Physician Assistants and Nurse Practitioners) who all work together to provide you with the care you need, when you need it.  We recommend signing up for the patient portal called "MyChart".  Sign up information is provided on this After Visit Summary.  MyChart is used to connect with patients for Virtual Visits (Telemedicine).  Patients are able to view lab/test results, encounter notes, upcoming appointments, etc.  Non-urgent messages can be sent to your provider as well.   To learn more about what you can do with MyChart, go to [email or web address].    Your next appointment:   Follow up as needed.

## 2020-01-04 NOTE — Progress Notes (Signed)
Cardiology Office Note  Date:  01/04/2020   ID:  BARTT GONZAGA, DOB [birth year], MRN [id number]  PCP:  Venia Carbon, MD   Chief Complaint  Patient presents with  . New Patient (Initial Visit)    Referred by Dr. Silvio Pate for evaluation of bradycardia; Meds verbally reviewed withpatient's daughter.    HPI:  Mr. Gerron Guidotti is a 86 year old gentleman with past medical history of Alzheimer's dementia Chronic kidney disease Hypertension History of stroke Who presents by referral from Dr. Silvio Pate for bradycardia/complete heart block  Referral placed September 21 Recently seen by primary care December 10, 2019, heart rate 47  Presents today with his daughter Has alheimers dementia, hard of hearing Most of the history obtained from the daughter who lives in [place] She reports at [facility] they have noted bradycardia on and off This bradycardia seems to correlate with gait instability Measured as low as 30 bpm at times "Lightheaded and dizzy" per daighter 1 bradycardic Gait instability, seems new likely associated with a bradycardia  He otherwise walks quickly, has had worsening issues from dementia  Daughter reports he is part of the "[place]" which I am unfamiliar with, she reports it was a packed that he made with his wife to consider suicide for any unforeseen circumstances  His previous wishes were nothing invasive Daughter is now struggling as to what to do  EKG personally reviewed by myself on todays visit Complete heart block with idioventricular rhythm rate 40 bpm left bundle branch block    PMH:   has a past medical history of Alzheimer's dementia ([place]), Chronic kidney disease, Chronic kidney disease, stage III (moderate) ([place]), Hypertension, Skin cancer, and Stroke (cerebrum) ([place]).  PSH:    Past Surgical History:  Procedure Laterality Date  . head hematoma surgery     from fall  . MOHS SURGERY      Current Outpatient Medications  Medication  Sig Dispense Refill  . amLODipine (NORVASC) 5 MG tablet Take 1 tablet (5 mg total) by mouth daily. 30 tablet 11  . Ascorbic Acid (VITAMIN C) 1000 MG tablet Take 1,000 mg by mouth daily.    Marland Kitchen aspirin EC 81 MG tablet Take 81 mg by mouth daily.    . Cholecalciferol (VITAMIN D3) 50 MCG (2000 UT) TABS Take 1 tablet by mouth daily.    . memantine (NAMENDA) 5 MG tablet Take 5 mg by mouth 2 (two) times daily.    . tamsulosin (FLOMAX) 0.4 MG CAPS capsule Take 1 capsule (0.4 mg total) by mouth daily. 30 capsule 0   No current facility-administered medications for this visit.     Allergies:   Cephalosporins and Ciprofloxacin   Social History:  The patient  reports that he has quit smoking. He has never used smokeless tobacco. He reports current alcohol use of about 1.0 standard drink of alcohol per week. He reports that he does not use drugs.   Family History:   family history includes CAD in his father; Lung cancer in his brother.    Review of Systems: Review of Systems  Constitutional: Negative.   HENT: Negative.   Respiratory: Negative.   Cardiovascular: Negative.   Gastrointestinal: Negative.   Musculoskeletal: Negative.   Neurological: Negative.   Psychiatric/Behavioral: Negative.   All other systems reviewed and are negative.   PHYSICAL EXAM: VS:  BP (!) 150/90 (BP Location: Right Arm, Patient Position: Sitting, Cuff Size: Normal)   Pulse (!) 40   Ht 5\' 8"  (1.727 m)  Wt 160 lb 8 oz (72.8 kg)   SpO2 97%   BMI 24.40 kg/m  , BMI Body mass index is 24.4 kg/m. GEN: Well nourished, well developed, confused, relatively nonverbal Presents in a wheelchair HEENT: normal Neck: no JVD, carotid bruits, or masses Cardiac: Bradycardic no murmurs, rubs, or gallops, trace ankle edema  Respiratory:  clear to auscultation bilaterally, normal work of breathing GI: soft, nontender, nondistended, + BS MS: no deformity or atrophy Skin: warm and dry, no rash Neuro: Good range of motion of legs,  arms Psych: Alert, quiet, relatively nonverbal, unable to hear  Recent Labs: No results found for requested labs within last 8760 hours.    Lipid Panel No results found for: CHOL, HDL, LDLCALC, TRIG    Wt Readings from Last 3 Encounters:  01/04/20 160 lb 8 oz (72.8 kg)  12/13/19 162 lb 3.2 oz (73.6 kg)  07/15/19 173 lb 9.6 oz (78.7 kg)       ASSESSMENT AND PLAN:  Problem List Items Addressed This Visit    None    Visit Diagnoses    Heart block AV complete (HCC)    -  Primary   Junctional escape rhythm       Alzheimer's dementia without behavioral disturbance, unspecified timing of dementia onset (HCC)         Complete heart block Suspect presenting in the past month per daughter who has received updates on vital signs from [facility] -She feels there is more gait instability in the setting of the heart block Unclear if it is waxing waning or progressing to permanent -Daughter has been a surrogate for the patient's wishes She clearly expresses that he did not want anything invasive He was part of the [employer], client's suicide which was to take sleeping pills Wife was going to start herself This was to come to pass if they became ill, they would go together Wife with severe dementia unable to help him with decisions He is unable to make decisions for himself, unable to communicate with him -Case discussed with Dr. Caryl Comes  Addendum: Case discussed further with Dr. Caryl Comes He was able to communicate effectively with the patient, placed the stethoscope in the patient's ears and he talked into the microphone/diaphragm He expressed to the patient that pacemaker was needed given complete heart block, bradycardia Patient with daughter present strongly indicated he did not want any procedures.  This was confirmed again, daughter aware.  We will not proceed with intervention at this time, no further work-up indicated As detailed by Dr. Caryl Comes, he may have some hypotension,  dizziness, shortness of breath on exertion, mobility may be limited No further cardiac work-up needed    Total encounter time more than 60 minutes  Greater than 50% was spent in counseling and coordination of care with the patient    Signed, Esmond Plants, M.D., Ph.D. [place], [place]

## 2020-01-04 NOTE — Progress Notes (Signed)
ELECTROPHYSIOLOGY CONSULT NOTE  Patient ID: Jacob Wade, MRN: [id number], DOB/AGE: 24-Dec-1923 86 y.o. Admit date: (Not on file) Date of Consult: 01/04/2020  Primary Physician: Venia Carbon, MD Primary Cardiologist:TG     Jacob Wade is a 86 y.o. male who is being seen today for the evaluation of CHB at the request of TG.    HPI Jacob Wade is a 86 y.o. male with significant dementia who was referred to cardiology today because of bradycardia and found to be in complete heart block.  He comes with his daughter.  He had been a previous about number of the [employer].  He had discussed previously not wanting to have invasive procedures done.  When telling him that his rhythm required either pacing or nothing, he was very certain in his response that he would prefer nothing.  His daughter was exceedingly grateful to hear him reiterate what she understood would be his longstanding wishes.  Date Cr K Hgb  11/20 1.22 3.5 13.4            Past Medical History:  Diagnosis Date  . Alzheimer's dementia ([place])   . Chronic kidney disease   . Chronic kidney disease, stage III (moderate) (HCC)   . Hypertension   . Skin cancer   . Stroke (cerebrum) [facility])       Surgical History:  Past Surgical History:  Procedure Laterality Date  . head hematoma surgery     from fall  . MOHS SURGERY       Home Meds: No outpatient medications have been marked as taking for the 01/04/20 encounter (Appointment) with Deboraha Sprang, MD.    Allergies:  Allergies  Allergen Reactions  . Cephalosporins Other (See Comments)    Reaction:  Tremors   . Ciprofloxacin Other (See Comments)    Reaction:  Unknown     Social History   Socioeconomic History  . Marital status: Married    Spouse name: Not on file  . Number of children: 22  . Years of education: Not on file  . Highest education level: Not on file  Occupational History  . Occupation: Academic librarian     Comment: [employer]  Tobacco Use  . Smoking status: Former Research scientist (life sciences)  . Smokeless tobacco: Never Used  . Tobacco comment: quit 1951  Vaping Use  . Vaping Use: Never used  Substance and Sexual Activity  . Alcohol use: Yes    Alcohol/week: 1.0 standard drink    Types: 1 Shots of liquor per week    Comment: Bourbon before dinner most days  . Drug use: No  . Sexual activity: Not on file  Other Topics Concern  . Not on file  Social History Narrative   1 son, 1 daughter      Has living will    Daughter has health care POA   Has DNR --confirmed and written 08/08/16   No tube feeds   [place] Society   Social Determinants of Health   Financial Resource Strain:   . Difficulty of Paying Living Expenses: Not on file  Food Insecurity:   . Worried About Charity fundraiser in the Last Year: Not on file  . Ran Out of Food in the Last Year: Not on file  Transportation Needs:   . Lack of Transportation (Medical): Not on file  . Lack of Transportation (Non-Medical): Not on file  Physical Activity:   . Days of Exercise per Week: Not  on file  . Minutes of Exercise per Session: Not on file  Stress:   . Feeling of Stress : Not on file  Social Connections:   . Frequency of Communication with Friends and Family: Not on file  . Frequency of Social Gatherings with Friends and Family: Not on file  . Attends Religious Services: Not on file  . Active Member of Clubs or Organizations: Not on file  . Attends Archivist Meetings: Not on file  . Marital Status: Not on file  Intimate Partner Violence:   . Fear of Current or Ex-Partner: Not on file  . Emotionally Abused: Not on file  . Physically Abused: Not on file  . Sexually Abused: Not on file     Family History  Problem Relation Age of Onset  . CAD Father   . Lung cancer Brother   . Kidney disease Neg Hx   . Prostate cancer Neg Hx      ROS:  Please see the history of present illness.     All other systems reviewed and  negative.    Physical Exam:  BP 150/90 P 40 General: Well developed, well nourished male in no acute distress. Head: Normocephalic, atraumatic, sclera non-icteric, no xanthomas, nares are without discharge. EENT: normal Neck: supple Back:without  Kyphosis  Lungs: . Breathing is unlabored. Heart: RRR with S1 S2.  Extremities: No clubbing or cyanosis. No  edema.  Distal pedal pulses are 2+ and equal bilaterally. Skin: Warm and Dry Neuro: Alert  CN III-XII intact Grossly normal sensory and motor function . Psych:  Responds to questions appropriately with a normal affect.      Labs: Cardiac Enzymes No results for input(s): CKTOTAL, CKMB, TROPONINI in the last 72 hours. CBC Lab Results  Component Value Date   WBC 11.2 (H) 08/24/2015   HGB 11.2 (L) 08/24/2015   HCT 33.4 (L) 08/24/2015   MCV 89.4 08/24/2015   PLT 231 08/24/2015   PROTIME: No results for input(s): LABPROT, INR in the last 72 hours. Chemistry No results for input(s): NA, K, CL, CO2, BUN, CREATININE, CALCIUM, PROT, BILITOT, ALKPHOS, ALT, AST, GLUCOSE in the last 168 hours.  Invalid input(s): LABALBU Lipids No results found for: CHOL, HDL, LDLCALC, TRIG BNP No results found for: PROBNP Thyroid Function Tests: No results for input(s): TSH, T4TOTAL, T3FREE, THYROIDAB in the last 72 hours.  Invalid input(s): FREET3 Miscellaneous No results found for: DDIMER  Radiology/Studies:  No results found.  EKG: Sinus rhythm 75 Complete heart block Ventricular escape right bundle left posterior fascicular  ECG reviewed 5/17 sinus rhythm with incomplete right bundle   Assessment and Plan:  Complete heart block  Dementia  End-of-life discussion  The patient has complete heart block.  It may be contributing to some of his gait instability.  Discussion with his daughter, healthcare POA who has long understood his wishes to not be inclined towards invasive therapies.  When I asked the patient the question directly  regarding pacing he was clearly not interested in any invasive procedure.  His daughter was strongly encouraged that she had understood his wishes correctly.  With surrogate decision making, it is appropriate at this juncture then not to proceed with pacing.  We discussed some of the potential consequences of not pacing including gait instability, some dyspnea, and potential progressive heart block and death.  In the case of the latter, would anticipate that it would be painless although could be associated with dyspnea.  Gait instability is a concern  but at this point he has not fallen and his blood pressure is quite satisfactory not withstanding his heart rate of 40.    Virl Axe

## 2020-03-16 ENCOUNTER — Other Ambulatory Visit: Payer: Self-pay

## 2020-03-16 ENCOUNTER — Ambulatory Visit: Payer: Medicare Other | Admitting: Internal Medicine

## 2020-03-16 ENCOUNTER — Encounter: Payer: Self-pay | Admitting: Internal Medicine

## 2020-03-16 VITALS — BP 140/63 | HR 42 | Temp 97.5°F | Resp 16 | Wt 160.0 lb

## 2020-03-16 DIAGNOSIS — I10 Essential (primary) hypertension: Secondary | ICD-10-CM

## 2020-03-16 DIAGNOSIS — N1832 Chronic kidney disease, stage 3b: Secondary | ICD-10-CM

## 2020-03-16 DIAGNOSIS — G309 Alzheimer's disease, unspecified: Secondary | ICD-10-CM

## 2020-03-16 DIAGNOSIS — N400 Enlarged prostate without lower urinary tract symptoms: Secondary | ICD-10-CM

## 2020-03-16 DIAGNOSIS — F0280 Dementia in other diseases classified elsewhere without behavioral disturbance: Secondary | ICD-10-CM

## 2020-03-16 DIAGNOSIS — I442 Atrioventricular block, complete: Secondary | ICD-10-CM | POA: Insufficient documentation

## 2020-03-16 NOTE — Assessment & Plan Note (Signed)
Mild but needs extra care due to apathy If worsens, could consider adding ritalin Continues on memantine

## 2020-03-16 NOTE — Assessment & Plan Note (Signed)
Rate is ~40 indicating likely junctional rhythm and heart block He is asymptomatic and no further action is desired--so will hold off on even an EKG

## 2020-03-16 NOTE — Assessment & Plan Note (Signed)
No action due to stability and age

## 2020-03-16 NOTE — Assessment & Plan Note (Signed)
BP Readings from Last 3 Encounters:  03/16/20 140/63  01/04/20 (!) 150/90  12/13/19 126/68   Usually acceptable control--especially for age On amlodipine

## 2020-03-16 NOTE — Assessment & Plan Note (Signed)
Seems to void well on the tamsulosin

## 2020-03-16 NOTE — Progress Notes (Addendum)
Subjective:    Patient ID: Jacob Wade, male    DOB: [birth year], 86 y.o.   MRN: [id number]  HPI Visit in assisted living apartment for follow up of chronic health conditions Reviewed status with AUdrey RN  No new concerns Still hard to motivate---especially in the morning Memory still poor Daily aide for AM care Uses bathroom and generally continent Same severe hearing loss---does affect his communication  No chest pain No SOB No dizziness or syncope No edema  Last GFR 41  Current Outpatient Medications on File Prior to Visit  Medication Sig Dispense Refill  . amLODipine (NORVASC) 5 MG tablet Take 1 tablet (5 mg total) by mouth daily. 30 tablet 11  . Ascorbic Acid (VITAMIN C) 1000 MG tablet Take 1,000 mg by mouth daily.    Marland Kitchen aspirin EC 81 MG tablet Take 81 mg by mouth daily.    . Cholecalciferol (VITAMIN D3) 50 MCG (2000 UT) TABS Take 1 tablet by mouth daily.    . memantine (NAMENDA) 5 MG tablet Take 5 mg by mouth 2 (two) times daily.    . tamsulosin (FLOMAX) 0.4 MG CAPS capsule Take 1 capsule (0.4 mg total) by mouth daily. 30 capsule 0   No current facility-administered medications on file prior to visit.    Allergies  Allergen Reactions  . Cephalosporins Other (See Comments)    Reaction:  Tremors   . Ciprofloxacin Other (See Comments)    Reaction:  Unknown     Past Medical History:  Diagnosis Date  . Alzheimer's dementia ([place])   . Chronic kidney disease   . Chronic kidney disease, stage III (moderate) (HCC)   . Hypertension   . Skin cancer   . Stroke (cerebrum) [facility])     Past Surgical History:  Procedure Laterality Date  . head hematoma surgery     from fall  . MOHS SURGERY      Family History  Problem Relation Age of Onset  . CAD Father   . Lung cancer Brother   . Kidney disease Neg Hx   . Prostate cancer Neg Hx     Social History   Socioeconomic History  . Marital status: Married    Spouse name: Not on file  . Number of children: 22   . Years of education: Not on file  . Highest education level: Not on file  Occupational History  . Occupation: Academic librarian    Comment: [employer]  Tobacco Use  . Smoking status: Former Research scientist (life sciences)  . Smokeless tobacco: Never Used  . Tobacco comment: quit 1951  Vaping Use  . Vaping Use: Never used  Substance and Sexual Activity  . Alcohol use: Yes    Alcohol/week: 1.0 standard drink    Types: 1 Shots of liquor per week    Comment: Bourbon before dinner most days  . Drug use: No  . Sexual activity: Not on file  Other Topics Concern  . Not on file  Social History Narrative   1 son, 1 daughter      Has living will    Daughter has health care POA   Has DNR --confirmed and written 08/08/16   No tube feeds   [place] Society   Social Determinants of Health   Financial Resource Strain: Not on file  Food Insecurity: Not on file  Transportation Needs: Not on file  Physical Activity: Not on file  Stress: Not on file  Social Connections: Not on file  Intimate Partner Violence: Not on  file   Review of Systems Appetite is fine Weight is stable Sleeps okay Bowels are fine Urine flow is okay. Intermittent nocturia--not often No sig back or leg pain    Objective:   Physical Exam Constitutional:      Appearance: Normal appearance.  Cardiovascular:     Rate and Rhythm: Regular rhythm. Bradycardia present.     Heart sounds: No murmur heard. No gallop.   Pulmonary:     Effort: Pulmonary effort is normal.     Breath sounds: Normal breath sounds. No wheezing or rales.  Abdominal:     Palpations: Abdomen is soft.     Tenderness: There is no abdominal tenderness.  Musculoskeletal:     Right lower leg: No edema.     Left lower leg: No edema.  Neurological:     Mental Status: He is alert.     Comments: Repeats himself constantly during visit  Psychiatric:        Mood and Affect: Mood normal.        Behavior: Behavior normal.            Assessment & Plan:

## 2020-03-24 ENCOUNTER — Other Ambulatory Visit: Payer: Self-pay | Admitting: Internal Medicine

## 2020-04-26 ENCOUNTER — Ambulatory Visit: Payer: Medicare Other | Admitting: Internal Medicine

## 2020-04-26 ENCOUNTER — Other Ambulatory Visit: Payer: Self-pay

## 2020-04-26 VITALS — BP 163/71 | HR 40 | Temp 96.0°F | Resp 17 | Wt 158.4 lb

## 2020-04-26 DIAGNOSIS — R5381 Other malaise: Secondary | ICD-10-CM | POA: Diagnosis not present

## 2020-04-26 DIAGNOSIS — G309 Alzheimer's disease, unspecified: Secondary | ICD-10-CM | POA: Diagnosis not present

## 2020-04-26 DIAGNOSIS — I442 Atrioventricular block, complete: Secondary | ICD-10-CM

## 2020-04-26 DIAGNOSIS — F0280 Dementia in other diseases classified elsewhere without behavioral disturbance: Secondary | ICD-10-CM | POA: Diagnosis not present

## 2020-04-27 ENCOUNTER — Telehealth: Payer: Self-pay

## 2020-04-27 NOTE — Telephone Encounter (Signed)
Discussed with daughter Since they were boosted and are not particularly sick---probably not candidates for monoclonal antibodies ER if worsening Will be moving to health care after isolation

## 2020-04-27 NOTE — Telephone Encounter (Signed)
Ann pts daughter and DPR signed left v/m requesting cb from Dr Silvio Pate; I spoke with Lelon Frohlich and she said since she left the initial v/m she got notification from [facility] that her father has been dx this afternoon with + covid. Lelon Frohlich said [facility] wanted to know if pt would need antigen for + covid due to pts health and age. Avie Echevaria NP saw pt on 04/26/20 at nursing home. Pt has dry cough and breathing seems worse; if you ask pt he says he is OK but pt has dementia. Ann said would have to ck with nursing staff at [place] to see if other covid symptoms. Lelon Frohlich also said that [facility] was to email Dr Silvio Pate about antigen. Ann request cb from Dr Silvio Pate. Sending note to Dr Silvio Pate and Larene Beach CMA.

## 2020-05-04 ENCOUNTER — Encounter: Payer: Self-pay | Admitting: Internal Medicine

## 2020-05-04 NOTE — Progress Notes (Signed)
Subjective:    Patient ID: Jacob Wade, male    DOB: 09/18/1923, 87 y.o.   MRN: [id number]  HPI  Asked to see resident in [place]. 85 RN reports general decline.  She has noticed he has been more fatigued lately.  It is taking him longer to complete ADLs. He is no longer going to the [facility].  He does continue to go down for meals.  He has had a slow, steady decline in weight. Daughter is considering moving him to healthcare.  Review of Systems      Past Medical History:  Diagnosis Date  . Alzheimer's dementia ([place])   . Chronic kidney disease   . Chronic kidney disease, stage III (moderate) (HCC)   . Hypertension   . Skin cancer   . Stroke (cerebrum) [facility])     Current Outpatient Medications  Medication Sig Dispense Refill  . amLODipine (NORVASC) 5 MG tablet Take 1 tablet (5 mg total) by mouth daily. 30 tablet 11  . Ascorbic Acid (VITAMIN C) 1000 MG tablet Take 1,000 mg by mouth daily.    Marland Kitchen aspirin EC 81 MG tablet Take 81 mg by mouth daily.    . Cholecalciferol (VITAMIN D3) 50 MCG (2000 UT) TABS Take 1 tablet by mouth daily.    . memantine (NAMENDA) 5 MG tablet Take 5 mg by mouth 2 (two) times daily.    . tamsulosin (FLOMAX) 0.4 MG CAPS capsule Take 1 capsule (0.4 mg total) by mouth daily. 30 capsule 0   No current facility-administered medications for this visit.    Allergies  Allergen Reactions  . Cephalosporins Other (See Comments)    Reaction:  Tremors   . Ciprofloxacin Other (See Comments)    Reaction:  Unknown     Family History  Problem Relation Age of Onset  . CAD Father   . Lung cancer Brother   . Kidney disease Neg Hx   . Prostate cancer Neg Hx     Social History   Socioeconomic History  . Marital status: Married    Spouse name: Not on file  . Number of children: 22  . Years of education: Not on file  . Highest education level: Not on file  Occupational History  . Occupation: Academic librarian    Comment: [employer]  Tobacco Use  . Smoking  status: Former Research scientist (life sciences)  . Smokeless tobacco: Never Used  . Tobacco comment: quit 1951  Vaping Use  . Vaping Use: Never used  Substance and Sexual Activity  . Alcohol use: Yes    Alcohol/week: 1.0 standard drink    Types: 1 Shots of liquor per week    Comment: Bourbon before dinner most days  . Drug use: No  . Sexual activity: Not on file  Other Topics Concern  . Not on file  Social History Narrative   1 son, 1 daughter      Has living will    Daughter has health care POA   Has DNR --confirmed and written 08/08/16   No tube feeds   [place]em[place] Society   Social Determinants of Health   Financial Resource Strain: Not on file  Food Insecurity: Not on file  Transportation Needs: Not on file  Physical Activity: Not on file  Stress: Not on file  Social Connections: Not on file  Intimate Partner Violence: Not on file     Constitutional: Patient reports fatigue.  Denies fever, malaise, headache or abrupt weight changes.  HEENT: Denies eye pain, eye redness,  ear pain, ringing in the ears, wax buildup, runny nose, nasal congestion, bloody nose, or sore throat. Respiratory: Patient reports intermittent shortness of breath.  Denies difficulty breathing, cough or sputum production.   Cardiovascular: Denies chest pain, chest tightness, palpitations or swelling in the hands or feet.  Gastrointestinal: Denies abdominal pain, bloating, constipation, diarrhea or blood in the stool.  Musculoskeletal: Patient reports generalized weakness.  Denies decrease in range of motion, difficulty with gait, muscle pain or joint pain and swelling.  Neurological: Patient reports difficulty with memory.  Denies dizziness, difficulty with speech or problems with balance and coordination.    No other specific complaints in a complete review of systems (except as listed in HPI above).  Objective:   Physical Exam  BP (!) 163/71   Pulse (!) 40   Temp (!) 96 F (35.6 C)   Resp 17   Wt 158 lb 6.4 oz (71.8 kg)    BMI 24.08 kg/m  Wt Readings from Last 3 Encounters:  05/04/20 158 lb 6.4 oz (71.8 kg)  03/16/20 160 lb (72.6 kg)  01/04/20 160 lb 8 oz (72.8 kg)    General: Appears his stated age, well developed, well nourished in NAD. Cardiovascular: Bradycardic with rhythm. S1,S2 noted.  No murmur, rubs or gallops noted.  Pulmonary/Chest: Normal effort and positive vesicular breath sounds. No respiratory distress. No wheezes, rales or ronchi noted.  Abdomen: Soft and nontender.  Neurological: Oriented to person. Slightly confused.  BMET    Component Value Date/Time   NA 136 08/24/2015 0617   K 4.5 08/22/2015 0326   CL 104 08/22/2015 0326   CO2 25 08/22/2015 0326   GLUCOSE 98 08/22/2015 0326   BUN 20 08/22/2015 0326   CREATININE 1.10 08/25/2015 0450   CALCIUM 7.7 (L) 08/22/2015 0326   GFRNONAA 57 (L) 08/25/2015 0450   GFRAA >60 08/25/2015 0450    Lipid Panel  No results found for: CHOL, TRIG, HDL, CHOLHDL, VLDL, LDLCALC  CBC    Component Value Date/Time   WBC 11.2 (H) 08/24/2015 0617   RBC 3.74 (L) 08/24/2015 0617   HGB 11.2 (L) 08/24/2015 0617   HCT 33.4 (L) 08/24/2015 0617   PLT 231 08/24/2015 0617   MCV 89.4 08/24/2015 0617   MCH 29.9 08/24/2015 0617   MCHC 33.4 08/24/2015 0617   RDW 14.2 08/24/2015 0617   LYMPHSABS 1.1 08/23/2015 0521   MONOABS 0.9 08/23/2015 0521   EOSABS 0.5 08/23/2015 0521   BASOSABS 0.0 08/23/2015 0521    Hgb A1C No results found for: HGBA1C      Assessment & Plan:   Decline in Status, Alzheimers:  Daughter has already planned to move to healthcare [place] 2 signed We will follow  Complete Heart Block:  Daughter has declined pacemaker Could be contributing to fatigue  We will reassess as needed Webb Silversmith, NP This visit occurred during the SARS-CoV-2 public health emergency.  Safety protocols were in place, including screening questions prior to the visit, additional usage of staff PPE, and extensive cleaning of exam room while observing  appropriate contact time as indicated for disinfecting solutions.

## 2020-05-04 NOTE — Patient Instructions (Signed)
Alzheimer's Disease Alzheimer's disease is a brain disease that affects memory, thinking, language, and behavior. People with Alzheimer's disease lose mental abilities, and the disease gets worse over time. Alzheimer's disease is a form of dementia. What are the causes? This condition develops when a protein called beta-amyloid forms deposits in the brain. It is not known what causes these deposits to form. Alzheimer's disease may also be caused by a gene mutation that is inherited from one parent or both parents. A gene mutation is a harmful change in a gene. Not everyone who inherits the genetic mutation will get the disease. What increases the risk? You are more likely to develop this condition if you:  Are older than age 65.  Are male.  Have any of these medical conditions: ? High blood pressure. ? Diabetes. ? Heart or blood vessel disease.  Smoke.  Have obesity.  Have had a brain injury.  Have had a stroke.  Have a family history of dementia. What are the signs or symptoms? Symptoms of this condition may happen in three stages, which often overlap. Early stage In this stage, you may continue to be independent. You may still be able to drive, work, and be social. Symptoms in this stage include:  Minor memory problems, such as forgetting a name, words, or what you did recently.  Difficulty with: ? Paying attention. ? Communicating. ? Doing familiar tasks. ? Problem solving or doing calculations. ? Following instructions. ? Learning new things.  Anxiety.  Social withdrawal.  Loss of motivation. Moderate stage In this stage, you will start to need care. Symptoms in this stage include:  Difficulty with expressing thoughts.  Memory loss that affects daily life. This can include forgetting: ? Recent events that have happened. ? If you have taken medicines or eaten. ? Familiar places. You may get lost while walking or driving. ? To pay bills or manage  finances. ? Personal hygiene such as bathing or using the bathroom.  Confusion about where you are or what time it is.  Difficulty in judging distance.  Changes in personality, mood, and behavior. You may be moody, irritable, angry, frustrated, fearful, anxious, or suspicious.  Poor reasoning and judgment.  Delusions or hallucinations.  Changes in sleep patterns. Severe stage In the final stage, you will need help with your personal care and daily activities. Symptoms in this stage include:  Worsening memory loss.  Personality changes.  Loss of awareness of your surroundings.  Changes in physical abilities, including the ability to walk, sit, and swallow.  Difficulty in communicating.  Inability to control your bladder and bowels.  Increasing confusion.  Increasing behavior changes. How is this diagnosed? This condition is diagnosed by a health care provider who specializes in diseases of the nervous system (neurologist) or one who specializes in care of the elderly (geriatrician or geriatric psychiatrist). Other causes of dementia may also be ruled out. Your health care provider will talk with you and your family, friends, or caregivers about your history and symptoms. A thorough medical history will be taken, and you will have a physical exam and tests. Tests may include:  Lab tests, such as blood or urine tests.  Imaging tests, such as a CT scan, a PET scan, or an MRI.  A lumbar puncture. This test involves removing and testing a small amount of the fluid that surrounds the brain and spinal cord.  An electroencephalogram (EEG). In this test, small metal discs are used to measure electrical activity in the brain.    Memory tests, cognitive tests, and neuropsychological tests. These tests evaluate brain function.  Genetic testing. This may be done if you have early onset of the disease (before age 60) or if other family members have the disease.   How is this  treated? At this time, there is no treatment to cure Alzheimer's disease or stop it from getting worse. The goals of treatment are:  To manage behavioral changes.  To provide you with a safe environment.  To help manage daily life for you and your caregivers. The following treatment options are available:  Medicines. Medicines may help the memory work better and manage behavioral symptoms.  Cognitive therapy. Cognitive therapy provides you with education, support, and memory aids. It is most helpful in the early stages of the condition.  Counseling or spiritual guidance. It is normal to have a lot of feelings, including anger, relief, fear, and isolation. Counseling and guidance can help you deal with these feelings.  Caregiving. This involves having caregivers help you with your daily activities.  Family support groups. These provide education, emotional support, and information about community resources to family members who are taking care of you. Follow these instructions at home: Medicines  Take over-the-counter and prescription medicines only as told by your health care provider.  Use a pill organizer or pill reminder to help you manage your medicines.  Avoid taking medicines that can affect thinking, such as pain medicines or sleeping medicines. Lifestyle  Make healthy lifestyle choices: ? Be physically active as told by your health care provider. Regular exercise may help improve symptoms. ? Do not use any products that contain nicotine or tobacco, such as cigarettes, e-cigarettes, and chewing tobacco. If you need help quitting, ask your health care provider. ? Do not drink alcohol. ? Eat a healthy diet. ? Practice stress-management techniques when you get stressed. ? Stay social.  Drink enough fluid to keep your urine pale yellow.  Make sure to get quality sleep. ? Avoid taking long naps during the day. Take short naps of 30 minutes or less if needed. ? Keep your  sleeping area dark and cool. ? Avoid exercising during the few hours before you go to bed. ? Avoid caffeine products in the afternoon and evening. General instructions  Work with your health care provider to determine what you need help with and what your safety needs are.  If you were given a bracelet that identifies you as a person with memory loss or tracks your location, make sure to wear it at all times.  Talk with your health care provider about whether it is safe for you to drive.  Work with your family to make important decisions, such as advance directives, medical power of attorney, or a living will.  Keep all follow-up visits. This is important.   Where to find more information  The Alzheimer's Association: Call the 24-hour helpline at 1-800-272-3900, or visit www.alz.org Contact a health care provider if:  You have nausea, vomiting, or trouble with eating related to a medicine.  You have worsening mood or behavior changes, such as depression, anxiety, or hallucinations.  You or your family members become concerned for your safety. Get help right away if:  You become less responsive or are difficult to wake up.  Your memory suddenly gets worse.  You feel that you want to harm yourself. If you ever feel like you may hurt yourself or others, or have thoughts about taking your own life, get help right away. Go to   your nearest emergency department or:  Call your local emergency services (911 in the [place]  Call a suicide crisis helpline, such as the National Suicide Prevention Lifeline at 1-800-273-8255. This is open 24 hours a day in the [place]  Text the Crisis Text Line at 741741 (in the [place] Summary  Alzheimer's disease is a brain disease that affects memory, thinking, language, and behavior. Alzheimer's disease is a form of dementia.  This condition is diagnosed by a specialist in diseases of the nervous system (neurologist) or one who specializes in care of the  elderly.  At this time, there is no treatment to cure Alzheimer's disease or stop it from getting worse. The goal of treatment is to help you manage any symptoms.  Work with your family to make important decisions, such as advance directives, medical power of attorney, or a living will. This information is not intended to replace advice given to you by your health care provider. Make sure you discuss any questions you have with your health care provider. Document Revised: 06/28/2019 Document Reviewed: 06/28/2019 Elsevier Patient Education  2021 Elsevier Inc.  

## 2020-05-16 DIAGNOSIS — N1832 Chronic kidney disease, stage 3b: Secondary | ICD-10-CM

## 2020-05-16 DIAGNOSIS — I5023 Acute on chronic systolic (congestive) heart failure: Secondary | ICD-10-CM

## 2020-05-16 DIAGNOSIS — G301 Alzheimer's disease with late onset: Secondary | ICD-10-CM

## 2020-05-16 DIAGNOSIS — N400 Enlarged prostate without lower urinary tract symptoms: Secondary | ICD-10-CM

## 2020-05-16 DIAGNOSIS — I442 Atrioventricular block, complete: Secondary | ICD-10-CM

## 2020-05-29 DIAGNOSIS — N1832 Chronic kidney disease, stage 3b: Secondary | ICD-10-CM | POA: Diagnosis not present

## 2020-05-31 DIAGNOSIS — M01X49 Direct infection of unspecified hand in infectious and parasitic diseases classified elsewhere: Secondary | ICD-10-CM | POA: Diagnosis not present

## 2020-06-12 ENCOUNTER — Telehealth: Payer: Self-pay | Admitting: Internal Medicine

## 2020-06-12 DIAGNOSIS — I5022 Chronic systolic (congestive) heart failure: Secondary | ICD-10-CM | POA: Diagnosis not present

## 2020-06-12 DIAGNOSIS — I442 Atrioventricular block, complete: Secondary | ICD-10-CM | POA: Diagnosis not present

## 2020-06-12 DIAGNOSIS — F39 Unspecified mood [affective] disorder: Secondary | ICD-10-CM | POA: Diagnosis not present

## 2020-06-12 DIAGNOSIS — G301 Alzheimer's disease with late onset: Secondary | ICD-10-CM | POA: Diagnosis not present

## 2020-06-12 DIAGNOSIS — N1832 Chronic kidney disease, stage 3b: Secondary | ICD-10-CM

## 2020-06-12 NOTE — Telephone Encounter (Signed)
To Dr. Caryl Comes to review prior to calling the patient/ his daughter back.

## 2020-06-12 NOTE — Telephone Encounter (Signed)
Patients daughter/POA calling about the prior discussion of a heart device being implanted. Patients legs are very swollen and weeping leaving his socks yet. Daughter would like to discuss the options for the patient   Please advise

## 2020-06-13 ENCOUNTER — Telehealth: Payer: Self-pay | Admitting: Internal Medicine

## 2020-06-13 NOTE — Telephone Encounter (Signed)
SCHEDULED

## 2020-06-13 NOTE — Telephone Encounter (Signed)
Would like to talk with her about her thought  please arrange telehealth visit as we discussed  Thanks SK

## 2020-06-13 NOTE — Telephone Encounter (Signed)
To scheduling to arrange for a telehealth visit with Dr. Caryl Comes potentially on Thursday 3/24 at 8:20 am.

## 2020-06-13 NOTE — Telephone Encounter (Signed)
  Patient Consent for Virtual Visit         Jacob Wade has provided verbal consent on 06/13/2020 for a virtual visit (video or telephone).   CONSENT FOR VIRTUAL VISIT FOR:  Jacob Wade  By participating in this virtual visit I agree to the following:  I hereby voluntarily request, consent and authorize [place] and its employed or contracted physicians, physician assistants, nurse practitioners or other licensed health care professionals (the Practitioner), to provide me with telemedicine health care services (the "Services") as deemed necessary by the treating Practitioner. I acknowledge and consent to receive the Services by the Practitioner via telemedicine. I understand that the telemedicine visit will involve communicating with the Practitioner through live audiovisual communication technology and the disclosure of certain medical information by electronic transmission. I acknowledge that I have been given the opportunity to request an in-person assessment or other available alternative prior to the telemedicine visit and am voluntarily participating in the telemedicine visit.  I understand that I have the right to withhold or withdraw my consent to the use of telemedicine in the course of my care at any time, without affecting my right to future care or treatment, and that the Practitioner or I may terminate the telemedicine visit at any time. I understand that I have the right to inspect all information obtained and/or recorded in the course of the telemedicine visit and may receive copies of available information for a reasonable fee.  I understand that some of the potential risks of receiving the Services via telemedicine include:  Marland Kitchen Delay or interruption in medical evaluation due to technological equipment failure or disruption; . Information transmitted may not be sufficient (e.g. poor resolution of images) to allow for appropriate medical decision making by the  Practitioner; and/or  . In rare instances, security protocols could fail, causing a breach of personal health information.  Furthermore, I acknowledge that it is my responsibility to provide information about my medical history, conditions and care that is complete and accurate to the best of my ability. I acknowledge that Practitioner's advice, recommendations, and/or decision may be based on factors not within their control, such as incomplete or inaccurate data provided by me or distortions of diagnostic images or specimens that may result from electronic transmissions. I understand that the practice of medicine is not an exact science and that Practitioner makes no warranties or guarantees regarding treatment outcomes. I acknowledge that a copy of this consent can be made available to me via my patient portal ([place]), or I can request a printed copy by calling the office of [place].    I understand that my insurance will be billed for this visit.   I have read or had this consent read to me. . I understand the contents of this consent, which adequately explains the benefits and risks of the Services being provided via telemedicine.  . I have been provided ample opportunity to ask questions regarding this consent and the Services and have had my questions answered to my satisfaction. . I give my informed consent for the services to be provided through the use of telemedicine in my medical care

## 2020-06-15 ENCOUNTER — Telehealth (INDEPENDENT_AMBULATORY_CARE_PROVIDER_SITE_OTHER): Payer: Medicare Other | Admitting: Internal Medicine

## 2020-06-15 DIAGNOSIS — I442 Atrioventricular block, complete: Secondary | ICD-10-CM

## 2020-06-15 DIAGNOSIS — N049 Nephrotic syndrome with unspecified morphologic changes: Secondary | ICD-10-CM | POA: Diagnosis not present

## 2020-06-15 NOTE — Progress Notes (Signed)
Electrophysiology TeleHealth Note   Due to national recommendations of social distancing due to COVID 19, an audio/video telehealth visit is felt to be most appropriate for this patient at this time.  See MyChart message from today for the patient's consent to telehealth for [facility].   Date:  06/15/2020   ID:  [name], DOB 1923/12/26, MRN [id number]  Location: patient's home  Provider location: [address] [place]  Evaluation Performed: Follow-up visit  PCP:  Venia Carbon, MD  Cardiologist:     Electrophysiologist:  SK   Chief Complaint:  Heart failure  History of Present Illness:    Jacob Wade is a 87 y.o. male who presents via audio/video conferencing for a telehealth visit today.  Since last being seen in our clinic for CHB for which he was quite clear that he desired no intervention, previously having been a member of the [employer] with intercurrent Covid infection and now living in healthcare facility, the patient's daughter reports progressive edema and swelling of lower extremities.  His PCP has started on him on diuretic  Diet may be salt replete and volume replete Recommendations for recliner dont work as he can not remember how to use it Currently ? Using depends    Past Medical History:  Diagnosis Date  . Alzheimer's dementia ([place])   . Chronic kidney disease   . Chronic kidney disease, stage III (moderate) ([facility])   . Hypertension   . Skin cancer   . Stroke (cerebrum) [facility])     Past Surgical History:  Procedure Laterality Date  . head hematoma surgery     from fall  . MOHS SURGERY      Current Outpatient Medications  Medication Sig Dispense Refill  . amLODipine (NORVASC) 5 MG tablet Take 1 tablet (5 mg total) by mouth daily. 30 tablet 11  . Ascorbic Acid (VITAMIN C) 1000 MG tablet Take 1,000 mg by mouth daily.    Marland Kitchen aspirin EC 81 MG tablet Take 81 mg by mouth daily.    . Cholecalciferol (VITAMIN D3) 50 MCG  (2000 UT) TABS Take 1 tablet by mouth daily.    . memantine (NAMENDA) 5 MG tablet Take 5 mg by mouth 2 (two) times daily.    . tamsulosin (FLOMAX) 0.4 MG CAPS capsule Take 1 capsule (0.4 mg total) by mouth daily. 30 capsule 0   No current facility-administered medications for this visit.    Allergies:   Cephalosporins and Ciprofloxacin   Social History:  The patient  reports that he has quit smoking. He has never used smokeless tobacco. He reports current alcohol use of about 1.0 standard drink of alcohol per week. He reports that he does not use drugs.   Family History:  The patient's   family history includes CAD in his father; Lung cancer in his brother.   ROS:  Please see the history of present illness.   All other systems are personally reviewed and negative.    Exam:    Vital Signs:  Last bp 10/21 156 2/22 159   Labs/Other Tests and Data Reviewed:    Recent Labs: No results found for requested labs within last 8760 hours.   Wt Readings from Last 3 Encounters:  05/04/20 158 lb 6.4 oz (71.8 kg)  03/16/20 160 lb (72.6 kg)  01/04/20 160 lb 8 oz (72.8 kg)     Other studies personally reviewed: Additional studies/ records that were reviewed today include: notes as  above  ASSESSMENT & PLAN:   Complete heart block  Dementia  Heart failure weeping edema  End-of-life discussion  Renal insufficiency grade 3  Hypertension     Daughter was asking as to whether the right decision was made 10/21 when she declined pacemaker implantation for bradycardia and complete heart block.  I reminded her that when the question was put to her father he reiterated to her relief that he did not want anything done.  Furthermore, I reminded her that her role as his healthcare power of attorney is to make the decisions on his behalf that he would make on his own behalf if he were able to make some to the best of her understanding.  That being the case, not implanting the pacemaker is the  right decision to have made in the right decision to make again.  The issue of the edema is troubling.  Unfortunately, efforts to keep his legs up have been thwarted by his inability to sit in a recliner and to remember how to use it.  Hence, would recommend a low-salt fluid restricted diet.  Could consider discontinuing the amlodipine and using alternative antihypertensive. Augmented diuresis for a few days might be helpful.  The daughter is concerned about the potential deleterious impact of wrapping weeping legs as they might result, with the removal of wraps the tearing of the skin  We will reach out to Dr. Silvio Pate, his PCP to help guide the decision making    COVID 19 screen The patient denies symptoms of COVID 19 at this time.  The importance of social distancing was discussed today.  Follow-up:  none    Current medicines are reviewed at length with the patient today.   The patient does not have concerns regarding his medicines.  The following changes were made today:  None but options as above   Labs/ tests ordered today include:  No orders of the defined types were placed in this encounter.    Today, I have spent 12 minutes with the patient with telehealth technology discussing the above.  Signed, Virl Axe, MD  06/15/2020 8:43 AM     [address] [place] [address] [phone] (office) [phone] (fax)

## 2020-06-15 NOTE — Patient Instructions (Addendum)
Medication Instructions:  - Your physician recommends that you continue on your current medications as directed. Please refer to the Current Medication list given to you today.  *If you need a refill on your cardiac medications before your next appointment, please call your pharmacy*   Lab Work: - none ordered  If you have labs (blood work) drawn today and your tests are completely normal, you will receive your results only by: Marland Kitchen MyChart Message (if you have MyChart) OR . A paper copy in the mail If you have any lab test that is abnormal or we need to change your treatment, we will call you to review the results.   Testing/Procedures: - none ordered   Follow-Up: At [facility], you and your health needs are our priority.  As part of our continuing mission to provide you with exceptional heart care, we have created designated Provider Care Teams.  These Care Teams include your primary Cardiologist (physician) and Advanced Practice Providers (APPs -  Physician Assistants and Nurse Practitioners) who all work together to provide you with the care you need, when you need it.  We recommend signing up for the patient portal called "MyChart".  Sign up information is provided on this After Visit Summary.  MyChart is used to connect with patients for Virtual Visits (Telemedicine).  Patients are able to view lab/test results, encounter notes, upcoming appointments, etc.  Non-urgent messages can be sent to your provider as well.   To learn more about what you can do with MyChart, go to [email or web address].    Your next appointment:   As needed   The format for your next appointment:   n/a  Provider:   Virl Axe, MD   Other Instructions n/a

## 2020-06-16 DIAGNOSIS — N183 Chronic kidney disease, stage 3 unspecified: Secondary | ICD-10-CM | POA: Diagnosis not present

## 2020-06-16 DIAGNOSIS — I5020 Unspecified systolic (congestive) heart failure: Secondary | ICD-10-CM | POA: Diagnosis not present

## 2020-06-16 DIAGNOSIS — I10 Essential (primary) hypertension: Secondary | ICD-10-CM | POA: Diagnosis not present

## 2020-06-26 DIAGNOSIS — L03116 Cellulitis of left lower limb: Secondary | ICD-10-CM | POA: Diagnosis not present

## 2020-06-29 DIAGNOSIS — N049 Nephrotic syndrome with unspecified morphologic changes: Secondary | ICD-10-CM | POA: Diagnosis not present

## 2020-06-30 DIAGNOSIS — C44622 Squamous cell carcinoma of skin of right upper limb, including shoulder: Secondary | ICD-10-CM | POA: Diagnosis not present

## 2020-06-30 DIAGNOSIS — L97819 Non-pressure chronic ulcer of other part of right lower leg with unspecified severity: Secondary | ICD-10-CM | POA: Diagnosis not present

## 2020-06-30 DIAGNOSIS — I83018 Varicose veins of right lower extremity with ulcer other part of lower leg: Secondary | ICD-10-CM | POA: Diagnosis not present

## 2020-06-30 DIAGNOSIS — R600 Localized edema: Secondary | ICD-10-CM | POA: Diagnosis not present

## 2020-07-19 DIAGNOSIS — N401 Enlarged prostate with lower urinary tract symptoms: Secondary | ICD-10-CM

## 2020-07-19 DIAGNOSIS — G309 Alzheimer's disease, unspecified: Secondary | ICD-10-CM | POA: Diagnosis not present

## 2020-07-19 DIAGNOSIS — I5020 Unspecified systolic (congestive) heart failure: Secondary | ICD-10-CM | POA: Diagnosis not present

## 2020-07-19 DIAGNOSIS — N183 Chronic kidney disease, stage 3 unspecified: Secondary | ICD-10-CM | POA: Diagnosis not present

## 2020-07-19 DIAGNOSIS — F39 Unspecified mood [affective] disorder: Secondary | ICD-10-CM | POA: Diagnosis not present

## 2020-08-03 DIAGNOSIS — F39 Unspecified mood [affective] disorder: Secondary | ICD-10-CM | POA: Diagnosis not present

## 2020-08-03 DIAGNOSIS — G301 Alzheimer's disease with late onset: Secondary | ICD-10-CM | POA: Diagnosis not present

## 2020-08-03 DIAGNOSIS — N1832 Chronic kidney disease, stage 3b: Secondary | ICD-10-CM

## 2020-08-03 DIAGNOSIS — I442 Atrioventricular block, complete: Secondary | ICD-10-CM | POA: Diagnosis not present

## 2020-08-03 DIAGNOSIS — I5022 Chronic systolic (congestive) heart failure: Secondary | ICD-10-CM | POA: Diagnosis not present

## 2020-08-03 DIAGNOSIS — I87319 Chronic venous hypertension (idiopathic) with ulcer of unspecified lower extremity: Secondary | ICD-10-CM

## 2020-08-24 DIAGNOSIS — N049 Nephrotic syndrome with unspecified morphologic changes: Secondary | ICD-10-CM | POA: Diagnosis not present

## 2020-08-24 DIAGNOSIS — N1832 Chronic kidney disease, stage 3b: Secondary | ICD-10-CM | POA: Diagnosis not present

## 2020-09-07 DIAGNOSIS — N1832 Chronic kidney disease, stage 3b: Secondary | ICD-10-CM | POA: Diagnosis not present

## 2020-09-07 DIAGNOSIS — N049 Nephrotic syndrome with unspecified morphologic changes: Secondary | ICD-10-CM | POA: Diagnosis not present

## 2020-09-08 DIAGNOSIS — R410 Disorientation, unspecified: Secondary | ICD-10-CM

## 2020-09-08 DIAGNOSIS — I5020 Unspecified systolic (congestive) heart failure: Secondary | ICD-10-CM

## 2020-09-08 DIAGNOSIS — K900 Celiac disease: Secondary | ICD-10-CM

## 2020-09-08 DIAGNOSIS — G309 Alzheimer's disease, unspecified: Secondary | ICD-10-CM

## 2020-09-08 DIAGNOSIS — L97929 Non-pressure chronic ulcer of unspecified part of left lower leg with unspecified severity: Secondary | ICD-10-CM

## 2020-09-08 DIAGNOSIS — R140 Abdominal distension (gaseous): Secondary | ICD-10-CM

## 2020-09-08 DIAGNOSIS — N183 Chronic kidney disease, stage 3 unspecified: Secondary | ICD-10-CM

## 2020-09-08 DIAGNOSIS — I872 Venous insufficiency (chronic) (peripheral): Secondary | ICD-10-CM

## 2020-09-15 ENCOUNTER — Ambulatory Visit: Payer: Medicare Other | Admitting: Physician Assistant

## 2020-09-18 ENCOUNTER — Telehealth: Payer: Self-pay

## 2020-09-18 NOTE — Telephone Encounter (Signed)
[facility], called to report that pt passed away on 2020-10-14 at 2:45 am

## 2020-09-18 NOTE — Telephone Encounter (Signed)
I was at [facility] this morning so I already knew---and have done his death certificate

## 2020-09-22 DEATH — deceased
